# Patient Record
Sex: Male | Born: 1980 | Race: Black or African American | Hispanic: No | Marital: Single | Smoking: Former smoker
Health system: Southern US, Community
[De-identification: ages and names within clinical notes are randomized; demographics above are authoritative.]

## PROBLEM LIST (undated history)

## (undated) DIAGNOSIS — I10 Essential (primary) hypertension: Secondary | ICD-10-CM

## (undated) DIAGNOSIS — E119 Type 2 diabetes mellitus without complications: Secondary | ICD-10-CM

## (undated) DIAGNOSIS — G43909 Migraine, unspecified, not intractable, without status migrainosus: Secondary | ICD-10-CM

## (undated) DIAGNOSIS — G8929 Other chronic pain: Secondary | ICD-10-CM

## (undated) DIAGNOSIS — M549 Dorsalgia, unspecified: Secondary | ICD-10-CM

## (undated) HISTORY — PX: WRIST SURGERY: SHX841

---

## 2005-04-27 ENCOUNTER — Emergency Department (HOSPITAL_COMMUNITY): Admission: EM | Admit: 2005-04-27 | Discharge: 2005-04-27 | Payer: Self-pay | Admitting: Emergency Medicine

## 2006-03-21 ENCOUNTER — Ambulatory Visit (HOSPITAL_BASED_OUTPATIENT_CLINIC_OR_DEPARTMENT_OTHER): Admission: RE | Admit: 2006-03-21 | Discharge: 2006-03-21 | Payer: Self-pay | Admitting: Orthopedic Surgery

## 2007-04-08 ENCOUNTER — Emergency Department (HOSPITAL_COMMUNITY): Admission: EM | Admit: 2007-04-08 | Discharge: 2007-04-08 | Payer: Self-pay | Admitting: Emergency Medicine

## 2007-05-02 ENCOUNTER — Emergency Department (HOSPITAL_COMMUNITY): Admission: EM | Admit: 2007-05-02 | Discharge: 2007-05-02 | Payer: Self-pay | Admitting: Emergency Medicine

## 2007-06-11 ENCOUNTER — Emergency Department (HOSPITAL_COMMUNITY): Admission: EM | Admit: 2007-06-11 | Discharge: 2007-06-11 | Payer: Self-pay | Admitting: Emergency Medicine

## 2007-09-18 ENCOUNTER — Emergency Department (HOSPITAL_COMMUNITY): Admission: EM | Admit: 2007-09-18 | Discharge: 2007-09-18 | Payer: Self-pay | Admitting: Emergency Medicine

## 2008-01-01 ENCOUNTER — Emergency Department (HOSPITAL_COMMUNITY): Admission: EM | Admit: 2008-01-01 | Discharge: 2008-01-01 | Payer: Self-pay | Admitting: Emergency Medicine

## 2008-05-07 ENCOUNTER — Emergency Department (HOSPITAL_COMMUNITY): Admission: EM | Admit: 2008-05-07 | Discharge: 2008-05-07 | Payer: Self-pay | Admitting: Emergency Medicine

## 2008-08-24 ENCOUNTER — Emergency Department (HOSPITAL_COMMUNITY): Admission: EM | Admit: 2008-08-24 | Discharge: 2008-08-25 | Payer: Self-pay | Admitting: Emergency Medicine

## 2008-12-11 ENCOUNTER — Emergency Department (HOSPITAL_COMMUNITY): Admission: EM | Admit: 2008-12-11 | Discharge: 2008-12-12 | Payer: Self-pay | Admitting: Emergency Medicine

## 2008-12-15 ENCOUNTER — Ambulatory Visit (HOSPITAL_COMMUNITY): Admission: RE | Admit: 2008-12-15 | Discharge: 2008-12-15 | Payer: Self-pay | Admitting: Family Medicine

## 2009-02-09 ENCOUNTER — Emergency Department (HOSPITAL_COMMUNITY): Admission: EM | Admit: 2009-02-09 | Discharge: 2009-02-09 | Payer: Self-pay | Admitting: Emergency Medicine

## 2009-04-23 ENCOUNTER — Emergency Department (HOSPITAL_COMMUNITY): Admission: EM | Admit: 2009-04-23 | Discharge: 2009-04-24 | Payer: Self-pay | Admitting: Emergency Medicine

## 2009-06-22 ENCOUNTER — Emergency Department (HOSPITAL_COMMUNITY): Admission: EM | Admit: 2009-06-22 | Discharge: 2009-06-22 | Payer: Self-pay | Admitting: Emergency Medicine

## 2009-07-27 ENCOUNTER — Emergency Department (HOSPITAL_COMMUNITY): Admission: EM | Admit: 2009-07-27 | Discharge: 2009-07-28 | Payer: Self-pay | Admitting: Emergency Medicine

## 2009-10-05 ENCOUNTER — Emergency Department (HOSPITAL_COMMUNITY): Admission: EM | Admit: 2009-10-05 | Discharge: 2009-10-05 | Payer: Self-pay | Admitting: Emergency Medicine

## 2009-10-11 ENCOUNTER — Emergency Department (HOSPITAL_COMMUNITY): Admission: EM | Admit: 2009-10-11 | Discharge: 2009-10-11 | Payer: Self-pay | Admitting: Emergency Medicine

## 2009-12-24 ENCOUNTER — Emergency Department (HOSPITAL_COMMUNITY): Admission: EM | Admit: 2009-12-24 | Discharge: 2009-12-25 | Payer: Self-pay | Admitting: Emergency Medicine

## 2010-01-06 ENCOUNTER — Emergency Department (HOSPITAL_COMMUNITY): Admission: EM | Admit: 2010-01-06 | Discharge: 2010-01-07 | Payer: Self-pay | Admitting: Emergency Medicine

## 2010-02-12 ENCOUNTER — Emergency Department (HOSPITAL_COMMUNITY): Admission: EM | Admit: 2010-02-12 | Discharge: 2010-02-12 | Payer: Self-pay | Admitting: Emergency Medicine

## 2010-06-10 ENCOUNTER — Emergency Department (HOSPITAL_COMMUNITY): Admission: EM | Admit: 2010-06-10 | Discharge: 2010-02-03 | Payer: Self-pay | Admitting: Emergency Medicine

## 2010-09-19 LAB — BASIC METABOLIC PANEL
BUN: 11 mg/dL (ref 6–23)
Chloride: 105 mEq/L (ref 96–112)
Creatinine, Ser: 0.91 mg/dL (ref 0.4–1.5)
Glucose, Bld: 148 mg/dL — ABNORMAL HIGH (ref 70–99)
Potassium: 3.8 mEq/L (ref 3.5–5.1)

## 2010-09-19 LAB — CBC
HCT: 46.7 % (ref 39.0–52.0)
MCV: 90.1 fL (ref 78.0–100.0)
Platelets: 261 10*3/uL (ref 150–400)
RDW: 13.4 % (ref 11.5–15.5)

## 2010-09-19 LAB — DIFFERENTIAL
Basophils Absolute: 0.1 10*3/uL (ref 0.0–0.1)
Basophils Relative: 1 % (ref 0–1)
Eosinophils Absolute: 0 10*3/uL (ref 0.0–0.7)
Eosinophils Relative: 0 % (ref 0–5)

## 2010-09-19 LAB — D-DIMER, QUANTITATIVE: D-Dimer, Quant: 0.25 ug/mL-FEU (ref 0.00–0.48)

## 2010-09-22 LAB — URINALYSIS, ROUTINE W REFLEX MICROSCOPIC
Bilirubin Urine: NEGATIVE
Glucose, UA: NEGATIVE mg/dL
Hgb urine dipstick: NEGATIVE
Nitrite: NEGATIVE
Specific Gravity, Urine: 1.03 (ref 1.005–1.030)
pH: 5.5 (ref 5.0–8.0)

## 2010-10-09 LAB — URINALYSIS, ROUTINE W REFLEX MICROSCOPIC
Glucose, UA: NEGATIVE mg/dL
Specific Gravity, Urine: >1.03 — ABNORMAL HIGH (ref 1.005–1.030)
Urobilinogen, UA: 0.2 mg/dL (ref 0.0–1.0)

## 2010-10-09 LAB — URINE MICROSCOPIC-ADD ON

## 2010-10-11 LAB — DIFFERENTIAL
Basophils Relative: 0 % (ref 0–1)
Eosinophils Absolute: 0.1 10*3/uL (ref 0.0–0.7)
Eosinophils Relative: 1 % (ref 0–5)
Monocytes Absolute: 0.6 10*3/uL (ref 0.1–1.0)
Monocytes Relative: 8 % (ref 3–12)
Neutrophils Relative %: 62 % (ref 43–77)

## 2010-10-11 LAB — CREATININE, SERUM: GFR calc Af Amer: >60 mL/min (ref >60)

## 2010-10-11 LAB — POCT I-STAT, CHEM 8
Calcium, Ion: 1.21 mmol/L (ref 1.12–1.32)
Glucose, Bld: 114 mg/dL — ABNORMAL HIGH (ref 70–99)
HCT: 49 % (ref 39.0–52.0)
Hemoglobin: 16.7 g/dL (ref 13.0–17.0)
TCO2: 23 mmol/L (ref 0–100)

## 2010-10-11 LAB — CBC
Hemoglobin: 16.2 g/dL (ref 13.0–17.0)
MCHC: 34.5 g/dL (ref 30.0–36.0)
MCV: 92 fL (ref 78.0–100.0)
RBC: 5.1 MIL/uL (ref 4.22–5.81)

## 2010-11-19 NOTE — Op Note (Signed)
[name], [name]                ACCOUNT NO.:  [id number]   MEDICAL RECORD NO.:  [id number]          PATIENT TYPE:  AMB   LOCATION:  DSC                          FACILITY:  [facility]   PHYSICIAN:  Cindee Salt, M.D.       DATE OF BIRTH:  Sep 09, 1980   DATE OF PROCEDURE:  03/21/2006  DATE OF DISCHARGE:                                 OPERATIVE REPORT   PREOPERATIVE DIAGNOSIS:  Subluxating extensor carpi  ulnaris tendon right  wrist with questionable triangular fibrocartilage complex tear.   POSTOPERATIVE DIAGNOSIS:  Subluxating extensor carpi ulnaris tendon right  wrist with questionable triangular fibrocartilage complex tear.   OPERATION:  Arthroscopy and repair peripheral 1B tear right wrist combined  Tuohy-Whipple technique with open reconstruction extensor carpi ulnaris  sheath.   SURGEON:  Cindee Salt, M.D.   ASSISTANT:  Carolyne Fiscal R.N.   ANESTHESIA:  Axillary block.   HISTORY:  The patient is a 30 year old male with a history of ulnar sided  wrist pain subluxating ECU tendon, questionable tear of the peripheral  aspect of the triangle fibrocartilage complex not responsive to conservative  treatment.   PROCEDURE:  The patient is brought to the operating room, questions answered  in the preoperative area.  The extremity is marked.  He is well aware of  risks and complications including infection, recurrence, incomplete relief  of symptoms, dystrophy, injury to arteries, nerves, tendons, the possible  injury to the dorsal sensory branch of the ulnar nerve.  The extremity was  marked by both the patient and surgeon.  The patient was brought to the  operating room where an axillary block was carried out without difficulty.  He was prepped using DuraPrep, supine position, right arm free.  After a 3  minute dry time he was draped.  The wrist placed in the arthroscopy tower,  10 pounds traction applied, joint inflated through a 3-4 portal.  A  transverse incision made through skin only,  deepened with hemostat.  Blunt  trocar was used enter the joint, the joint was inspected. The volar radial  wrist ligaments carpal articular surface was intact.  The scapholunate  lunotriquetral ligaments were intact.  An irrigation catheter was placed in  the 6U, a 4-5 6U portal opened after localization with a 22 gauge needle.  A  probe was introduced. Significant granulation tissue was present at the  dorsal ulnar aspect of the attachment of the triangular fibrocartilage. A  probe was placed and the triangular fibrocartilage was easily separated from  the capsule.  The midcarpal joint was then inspected after localization and  inflation of the joint through the midcarpal radial portal.  The joint was  entered using a hemostat and a blunt trocar. The CT joint showed no changes.  There was no instability of the scapholunate and lunotriquetral joint, type  2 lunate was present, there were no changes in the proximal hamate or  capitate.  A Tuohy needle was then used to pass the suture after debridement  of the granulation tissue of the triangular fibrocartilage. This was done  with the scope in the 4-5  portal.  The 1-2 portal was entered with bent  Tuohy needle.  This was then traversed across the joint.  The scope was then  introduced in the 3-4 portal.  The Tuohy needle was grasped and passed  through the dorsal margin of the triangular fibrocartilage complex.  The  limb was then elevated for exsanguination. A tourniquet placed high on the  arm was inflated to 250 mm.  A longitudinal incision was then made over the  ulnar aspect of the wrist, carried down through subcutaneous tissue.  Bleeders were electrocauterized.  The dorsal sensory branch of the ulnar  nerve was looked for but was not seen.  An incision was then made in the  sixth dorsal compartment.  This allowed visualization of the extensor carpi  ulnaris tendon which was easily subluxating from its sheath.  The sheath was   maintained on its most dorsal aspect.  The Tuohy needle was then brought  back through the skin, the skin undermined, and this was able to be isolated  at the margin of the sixth dorsal compartment.  The scope was reintroduced.  An 18 gauge needle was then passed through the base of the sheath of the  ECU.  This was identified and the suture was then brought through the radial  side of the wrist. A stainless steel monofilament wire was then passed  through the 18 gauge needle and the suture looped through this and brought  out and tied firmly securing the triangular fibrocartilage back to the  dorsal capsule.  The area was irrigated.  The ECU sheath was then imbricated  stabilizing the ECU tendon.  This was done with figure-of-eight 4-0  Mersilene sutures.  The skin was then closed with a subcuticular 2-0 Vicryl  suture.  The portals were closed with interrupted 5-0 nylon sutures.  A  sterile compressive dressing and long-arm splint with the wrist supinated  was then applied.  The patient tolerated the procedure well and was taken to  the recovery room for observation in satisfactory condition.  He will be  discharged home to return to the [facility] in one week on  Vicodin.           ______________________________  Cindee Salt, M.D.     GK/MEDQ  D:  03/21/2006  T:  03/22/2006  Job:  [id number]

## 2011-05-26 ENCOUNTER — Encounter: Payer: Self-pay | Admitting: *Deleted

## 2011-05-26 ENCOUNTER — Emergency Department (HOSPITAL_COMMUNITY)
Admission: EM | Admit: 2011-05-26 | Discharge: 2011-05-26 | Disposition: A | Discharge status: Home / Self Care | Payer: Self-pay | Attending: Emergency Medicine | Admitting: Emergency Medicine

## 2011-05-26 DIAGNOSIS — G8929 Other chronic pain: Secondary | ICD-10-CM | POA: Insufficient documentation

## 2011-05-26 DIAGNOSIS — F17200 Nicotine dependence, unspecified, uncomplicated: Secondary | ICD-10-CM | POA: Insufficient documentation

## 2011-05-26 DIAGNOSIS — IMO0002 Reserved for concepts with insufficient information to code with codable children: Secondary | ICD-10-CM | POA: Insufficient documentation

## 2011-05-26 DIAGNOSIS — G44209 Tension-type headache, unspecified, not intractable: Secondary | ICD-10-CM | POA: Insufficient documentation

## 2011-05-26 DIAGNOSIS — M62838 Other muscle spasm: Secondary | ICD-10-CM | POA: Insufficient documentation

## 2011-05-26 DIAGNOSIS — M549 Dorsalgia, unspecified: Secondary | ICD-10-CM | POA: Insufficient documentation

## 2011-05-26 DIAGNOSIS — I10 Essential (primary) hypertension: Secondary | ICD-10-CM | POA: Insufficient documentation

## 2011-05-26 HISTORY — DX: Other chronic pain: G89.29

## 2011-05-26 HISTORY — DX: Dorsalgia, unspecified: M54.9

## 2011-05-26 HISTORY — DX: Essential (primary) hypertension: I10

## 2011-05-26 MED ORDER — DIPHENHYDRAMINE HCL 50 MG/ML IJ SOLN
25.0000 mg | Freq: Once | INTRAMUSCULAR | Status: AC
  Administered 2011-05-26: 25 mg via INTRAVENOUS
  Filled 2011-05-26: qty 1

## 2011-05-26 MED ORDER — HYDROMORPHONE HCL PF 1 MG/ML IJ SOLN
1.0000 mg | Freq: Once | INTRAMUSCULAR | Status: AC
  Administered 2011-05-26: 1 mg via INTRAVENOUS
  Filled 2011-05-26: qty 1

## 2011-05-26 MED ORDER — PROMETHAZINE HCL 25 MG/ML IJ SOLN
25.0000 mg | Freq: Once | INTRAMUSCULAR | Status: AC
  Administered 2011-05-26: 25 mg via INTRAVENOUS
  Filled 2011-05-26: qty 1

## 2011-05-26 MED ORDER — SODIUM CHLORIDE 0.9 % IV BOLUS (SEPSIS)
1000.0000 mL | Freq: Once | INTRAVENOUS | Status: AC
  Administered 2011-05-26: 1000 mL via INTRAVENOUS

## 2011-05-26 MED ORDER — IBUPROFEN 800 MG PO TABS: 800.0000 mg | ORAL_TABLET | Freq: Three times a day (TID) | ORAL | Status: AC | PRN

## 2011-05-26 MED ORDER — DEXAMETHASONE SODIUM PHOSPHATE 4 MG/ML IJ SOLN
10.0000 mg | Freq: Once | INTRAMUSCULAR | Status: AC
  Administered 2011-05-26: 10 mg via INTRAVENOUS
  Filled 2011-05-26: qty 3

## 2011-05-26 NOTE — ED Provider Notes (Signed)
Scribed for Corey Bonier, MD, the patient was seen in room APA12/APA12. This chart was scribed by [employer]. The patient's care started at 10:03  CSN: [id number] Arrival date & time: 05/26/2011 10:02 AM   First MD Initiated Contact with Patient 05/26/11 1003      Chief Complaint  Patient presents with  . Migraine  . Back Pain   HPI Corey [name] is a 30 y.o. male who presents to the Emergency Department complaining of Migraine. Migraine is with gradual onset around 7pm last night. Reports a history of headaches. Migraine is aggravated by headaches. He also complains of back pain. Per wife, patient's blood pressure has been "very high" recently. Reports a history of back injury las year. He also compains of a chronic numbness in his groin area.  Past Medical History  Diagnosis Date  . Hypertension   . Chronic back pain     Past Surgical History  Procedure Date  . Wrist surgery right    History reviewed. No pertinent family history.  History  Substance Use Topics  . Smoking status: Current Everyday Smoker -- 0.5 packs/day    Types: Cigarettes  . Smokeless tobacco: Not on file  . Alcohol Use: Yes     1-2 beers every weekend      Review of Systems  Constitutional: Negative for fever and unexpected weight change.  HENT: Negative for ear pain, sore throat, neck pain and neck stiffness.   Eyes: Negative for visual disturbance.  Respiratory: Negative for cough.   Cardiovascular: Negative for chest pain.  Gastrointestinal: Negative for vomiting and diarrhea.  Genitourinary: Negative.   Musculoskeletal: Negative for myalgias.  Skin: Negative for rash.  Neurological: Positive for headaches. Negative for dizziness, seizures and syncope.  Hematological: Negative.   Psychiatric/Behavioral: Negative.   All other systems reviewed and are negative.    Allergies  Hydrocodone  Home Medications  No current outpatient prescriptions on file.  BP 159/113  Pulse 70   Temp(Src) 97.6 F (36.4 C) (Oral)  Resp 18  Ht 6\' 3"  (1.905 m)  Wt 250 lb (113.399 kg)  BMI 31.25 kg/m2  SpO2 99%  Physical Exam  Nursing note and vitals reviewed. Constitutional: He is oriented to person, place, and time. He appears well-developed and well-nourished.       Awake, alert, nontoxic appearance with baseline speech for patient.  HENT:  Head: Normocephalic and atraumatic.  Right Ear: Tympanic membrane normal.  Left Ear: Tympanic membrane normal.  Mouth/Throat: Oropharynx is clear and moist. No oropharyngeal exudate, posterior oropharyngeal edema or posterior oropharyngeal erythema.  Eyes: EOM are normal. Pupils are equal, round, and reactive to light. Right eye exhibits no discharge. Left eye exhibits no discharge.       Normal funduscopic exam  Neck: Normal range of motion. Neck supple. No Brudzinski's sign and no Kernig's sign noted.       No neck stiffness  Cardiovascular: Normal rate, regular rhythm and normal heart sounds.   No murmur heard. Pulmonary/Chest: Effort normal and breath sounds normal. No stridor. No respiratory distress. He has no wheezes. He has no rales. He exhibits no tenderness.  Abdominal: Soft. Bowel sounds are normal. He exhibits no mass. There is no tenderness. There is no rebound.  Musculoskeletal: Normal range of motion. He exhibits no tenderness.       Mild tenderness at the Lumbar spine. No deformity appreciated Some left peri-lumbar muscular spasm  Lymphadenopathy:    He has no cervical adenopathy.  Neurological: He  is alert and oriented to person, place, and time. He has normal reflexes. He displays no atrophy and no tremor. No cranial nerve deficit or sensory deficit. He exhibits normal muscle tone. Coordination and gait normal. GCS eye subscore is 4. GCS verbal subscore is 5. GCS motor subscore is 6.  Reflex Scores:      Tricep reflexes are 2+ on the right side and 2+ on the left side.      Bicep reflexes are 2+ on the right side and 2+ on  the left side.      Brachioradialis reflexes are 2+ on the right side and 2+ on the left side.      Patellar reflexes are 2+ on the right side and 2+ on the left side.      Achilles reflexes are 2+ on the right side and 2+ on the left side.      Normal finger-nose-finger test Normal coordination No Disdiadochokinesia Normal visual fields to palpitation. Normal DTRs  Skin: No rash noted.  Psychiatric: He has a normal mood and affect.    ED Course  Procedures DIAGNOSTIC STUDIES: Oxygen Saturation is 99% on room air, normal by my interpretation.    COORDINATION OF CARE: 10:10 - EDP examined patient at bedside. IV fluids and pain management ordered.  11:34 AM This patient reports that his headache is now resolved in his back pain is improved and he would like to go home.  MDM: The patient is known to have chronic back pain , and he has had no recent injury to his back, and I do not see a need for lumbar spine x-rays at this time as they have been performed previously. The patient is known to have degenerative disc disease and he has muscle spasm of the left paraspinal lumbar muscles suggestive of muscle strain and spasm. He tells me that he has a history of recurrent headaches for which the one he is presenting is typical, and he has a negative neurologic exam and symptoms that are suggestive of muscular tension headache. At this time I will not order a CT scan of his brain as I don't find suggestion on his physical examination of focal neurologic abnormalities.   Scribe Attestation I personally performed the services described in this documentation, which was scribed in my presence. The recorded information has been reviewed and considered.      Corey Bonier, MD 05/26/11 1134

## 2011-05-26 NOTE — ED Notes (Signed)
Pt reports migraine since 7pm last night with nausea, denies vomiting.  Also reports history of sciatica and for the past 2 days has been having lower back pain radiating down r leg.

## 2011-05-26 NOTE — ED Notes (Signed)
Pt c/o severe migraine that started last night around 7pm and back pain

## 2011-12-10 ENCOUNTER — Emergency Department (HOSPITAL_COMMUNITY)
Admission: EM | Admit: 2011-12-10 | Discharge: 2011-12-10 | Disposition: A | Discharge status: Home / Self Care | Payer: Self-pay | Attending: Emergency Medicine | Admitting: Emergency Medicine

## 2011-12-10 ENCOUNTER — Encounter (HOSPITAL_COMMUNITY): Payer: Self-pay

## 2011-12-10 DIAGNOSIS — G8929 Other chronic pain: Secondary | ICD-10-CM | POA: Insufficient documentation

## 2011-12-10 DIAGNOSIS — I10 Essential (primary) hypertension: Secondary | ICD-10-CM | POA: Insufficient documentation

## 2011-12-10 DIAGNOSIS — R51 Headache: Secondary | ICD-10-CM | POA: Insufficient documentation

## 2011-12-10 HISTORY — DX: Migraine, unspecified, not intractable, without status migrainosus: G43.909

## 2011-12-10 MED ORDER — HYDROMORPHONE HCL PF 1 MG/ML IJ SOLN
1.0000 mg | Freq: Once | INTRAMUSCULAR | Status: AC
  Administered 2011-12-10: 1 mg via INTRAVENOUS
  Filled 2011-12-10: qty 1

## 2011-12-10 MED ORDER — ONDANSETRON HCL 4 MG/2ML IJ SOLN
4.0000 mg | Freq: Once | INTRAMUSCULAR | Status: AC
  Administered 2011-12-10: 4 mg via INTRAVENOUS
  Filled 2011-12-10: qty 2

## 2011-12-10 MED ORDER — DIPHENHYDRAMINE HCL 50 MG/ML IJ SOLN
25.0000 mg | Freq: Once | INTRAMUSCULAR | Status: AC
  Administered 2011-12-10: 25 mg via INTRAVENOUS
  Filled 2011-12-10: qty 1

## 2011-12-10 MED ORDER — KETOROLAC TROMETHAMINE 30 MG/ML IJ SOLN
30.0000 mg | Freq: Once | INTRAMUSCULAR | Status: AC
  Administered 2011-12-10: 30 mg via INTRAVENOUS
  Filled 2011-12-10: qty 1

## 2011-12-10 MED ORDER — HYDROMORPHONE HCL PF 1 MG/ML IJ SOLN
INTRAMUSCULAR | Status: AC
  Administered 2011-12-10: 1 mg
  Filled 2011-12-10: qty 1

## 2011-12-10 MED ORDER — TRAMADOL HCL 50 MG PO TABS: 50.0000 mg | ORAL_TABLET | Freq: Four times a day (QID) | ORAL | Status: AC | PRN

## 2011-12-10 MED ORDER — METOCLOPRAMIDE HCL 5 MG/ML IJ SOLN
10.0000 mg | Freq: Once | INTRAMUSCULAR | Status: AC
  Administered 2011-12-10: 10 mg via INTRAVENOUS
  Filled 2011-12-10: qty 2

## 2011-12-10 MED ORDER — ONDANSETRON HCL 8 MG PO TABS: 8.0000 mg | ORAL_TABLET | ORAL | Status: AC | PRN

## 2011-12-10 NOTE — ED Provider Notes (Signed)
History   This chart was scribed for Donnetta Hutching, MD by Sofie Rower. The patient was seen in room APA05/APA05 and the patient's care was started at 10:37 AM     CSN: [id number]  Arrival date & time 12/10/11  4098   First MD Initiated Contact with Patient 12/10/11 1004      Chief Complaint  Patient presents with  . Headache    (Consider location/radiation/quality/duration/timing/severity/associated sxs/prior treatment) HPI  Corey [name] is a 31 y.o. male who presents to the Emergency Department complaining of moderate, episodic headache onset three days ago with associated symptoms of photophobia. The pt states the "headache is located right behind my eyes." Modifying factors include Excedrin migraine, tylenol extra strength which provide moderate relief. Pt has a hx of hypertension, chronic back pain, and migraine headaches.   Pt denies having insurance.     Past Medical History  Diagnosis Date  . Hypertension   . Chronic back pain   . Migraine     Past Surgical History  Procedure Date  . Wrist surgery right      History  Substance Use Topics  . Smoking status: Current Everyday Smoker -- 0.5 packs/day    Types: Cigarettes  . Smokeless tobacco: Not on file  . Alcohol Use: Yes     1-2 beers every weekend      Review of Systems  All other systems reviewed and are negative.    10 Systems reviewed and all are negative for acute change except as noted in the HPI.    Allergies  Hydrocodone  Home Medications   Current Outpatient Rx  Name Route Sig Dispense Refill  . ACETAMINOPHEN 500 MG PO TABS Oral Take 500 mg by mouth every 6 (six) hours as needed. Pain       BP 164/103  Pulse 76  Temp 98.6 F (37 C)  Resp 20  Ht 6\' 3"  (1.905 m)  Wt 265 lb (120.203 kg)  BMI 33.12 kg/m2  SpO2 100%  Physical Exam  Nursing note and vitals reviewed. Constitutional: He appears well-developed and well-nourished.  HENT:  Right Ear: External ear normal.  Left Ear:  External ear normal.  Nose: Nose normal.  Eyes:       Photophobic.   Neck: Normal range of motion.  Cardiovascular: Normal rate and normal heart sounds.   Pulmonary/Chest: Effort normal.  Musculoskeletal: Normal range of motion.  Neurological: He is alert.  Skin: Skin is warm and dry.  Psychiatric: He has a normal mood and affect. His behavior is normal.    ED Course  Procedures (including critical care time)  DIAGNOSTIC STUDIES: Oxygen Saturation is 100% on room air, normal by my interpretation.    COORDINATION OF CARE:  10:39 AM-EDP at bedside discusses treatment plan concerning IV Toradol, benadryl, regalin.  12:39PM- Recheck. EDP at bedside discusses treatment plan.    Labs Reviewed - No data to display No results found.   No diagnosis found.    MDM  Recheck prior to discharge.  Feeling better. Decreased headache. No neuro deficits. No stiff neck       I personally performed the services described in this documentation, which was scribed in my presence. The recorded information has been reviewed and considered.    Donnetta Hutching, MD 12/10/11 [phone]

## 2011-12-10 NOTE — ED Notes (Signed)
Pt states meds starting to work. Rating pain 8 at this time. On cell phone

## 2011-12-10 NOTE — ED Notes (Signed)
Pt medicated for pain.

## 2011-12-10 NOTE — Discharge Instructions (Signed)
Medication for pain and nausea. Increase fluids. Rest. Return if worse.

## 2011-12-10 NOTE — ED Notes (Signed)
Complain of headache. Out of BP med

## 2013-03-12 ENCOUNTER — Emergency Department (HOSPITAL_COMMUNITY): Payer: Self-pay

## 2013-03-12 ENCOUNTER — Emergency Department (HOSPITAL_COMMUNITY)
Admission: EM | Admit: 2013-03-12 | Discharge: 2013-03-12 | Disposition: A | Discharge status: Home / Self Care | Payer: Self-pay | Attending: Emergency Medicine | Admitting: Emergency Medicine

## 2013-03-12 ENCOUNTER — Encounter (HOSPITAL_COMMUNITY): Payer: Self-pay | Admitting: *Deleted

## 2013-03-12 DIAGNOSIS — R079 Chest pain, unspecified: Secondary | ICD-10-CM | POA: Insufficient documentation

## 2013-03-12 DIAGNOSIS — I10 Essential (primary) hypertension: Secondary | ICD-10-CM | POA: Insufficient documentation

## 2013-03-12 DIAGNOSIS — Z79899 Other long term (current) drug therapy: Secondary | ICD-10-CM | POA: Insufficient documentation

## 2013-03-12 DIAGNOSIS — R0602 Shortness of breath: Secondary | ICD-10-CM | POA: Insufficient documentation

## 2013-03-12 DIAGNOSIS — Z87891 Personal history of nicotine dependence: Secondary | ICD-10-CM | POA: Insufficient documentation

## 2013-03-12 DIAGNOSIS — G8929 Other chronic pain: Secondary | ICD-10-CM | POA: Insufficient documentation

## 2013-03-12 DIAGNOSIS — G43909 Migraine, unspecified, not intractable, without status migrainosus: Secondary | ICD-10-CM | POA: Insufficient documentation

## 2013-03-12 LAB — CBC WITH DIFFERENTIAL/PLATELET
Eosinophils Relative: 1 % (ref 0–5)
HCT: 46.1 % (ref 39.0–52.0)
Hemoglobin: 16.4 g/dL (ref 13.0–17.0)
Lymphocytes Relative: 48 % — ABNORMAL HIGH (ref 12–46)
MCHC: 35.6 g/dL (ref 30.0–36.0)
MCV: 88.8 fL (ref 78.0–100.0)
Monocytes Absolute: 0.5 10*3/uL (ref 0.1–1.0)
Monocytes Relative: 6 % (ref 3–12)
Neutro Abs: 3.7 10*3/uL (ref 1.7–7.7)
RDW: 13.3 % (ref 11.5–15.5)
WBC: 8.5 10*3/uL (ref 4.0–10.5)

## 2013-03-12 LAB — BASIC METABOLIC PANEL
BUN: 12 mg/dL (ref 6–23)
CO2: 25 mEq/L (ref 19–32)
Chloride: 101 mEq/L (ref 96–112)
Creatinine, Ser: 1.13 mg/dL (ref 0.50–1.35)
GFR calc Af Amer: >90 mL/min (ref >90)
Potassium: 3.7 mEq/L (ref 3.5–5.1)

## 2013-03-12 MED ORDER — NAPROXEN 500 MG PO TABS: 500.0000 mg | ORAL_TABLET | Freq: Two times a day (BID) | ORAL | Status: DC

## 2013-03-12 MED ORDER — CYCLOBENZAPRINE HCL 10 MG PO TABS: 10.0000 mg | ORAL_TABLET | Freq: Two times a day (BID) | ORAL | Status: DC | PRN

## 2013-03-12 NOTE — ED Notes (Signed)
Pt reporting pain in throat and down mid chest.  Reports increased cough.  States he just quit smoking about 1 week ago.  Pt reporting headache. Denies nausea or vomiting.

## 2013-03-12 NOTE — ED Provider Notes (Signed)
CSN: [id number]     Arrival date & time 03/12/13  0002 History   First MD Initiated Contact with Patient 03/12/13 0020     Chief Complaint  Patient presents with  . Shortness of Breath  . Chest Pain   (Consider location/radiation/quality/duration/timing/severity/associated sxs/prior Treatment) HPI.... intermittent chest pain for 10 days described as pins and needles with no associated dyspnea, diaphoresis, nausea. Patient recently quit smoking. Nothing makes symptoms better or worse. Severity is mild. No family history of cardiac disease. Patient has hypertension and  takes lisinopril.  Past Medical History  Diagnosis Date  . Hypertension   . Chronic back pain   . Migraine    Past Surgical History  Procedure Laterality Date  . Wrist surgery  right   History reviewed. No pertinent family history. History  Substance Use Topics  . Smoking status: Former Smoker -- 0.50 packs/day    Types: Cigarettes  . Smokeless tobacco: Not on file  . Alcohol Use: Yes     Comment: 1-2 beers every weekend    Review of Systems  All other systems reviewed and are negative.    Allergies  Hydrocodone and Morphine and related  Home Medications   Current Outpatient Rx  Name  Route  Sig  Dispense  Refill  . lisinopril (PRINIVIL,ZESTRIL) 20 MG tablet   Oral   Take 20 mg by mouth daily.         Marland Kitchen acetaminophen (TYLENOL) 500 MG tablet   Oral   Take 1,500 mg by mouth every 6 (six) hours as needed. Pain/headaches         . aspirin-acetaminophen-caffeine (EXCEDRIN MIGRAINE) 250-250-65 MG per tablet   Oral   Take 1 tablet by mouth every 6 (six) hours as needed. For migraines          BP 139/89  Pulse 62  Temp(Src) 98.2 F (36.8 C) (Oral)  Resp 18  Ht 6\' 3"  (1.905 m)  Wt 280 lb (127.007 kg)  BMI 35 kg/m2  SpO2 98% Physical Exam  Nursing note and vitals reviewed. Constitutional: He is oriented to person, place, and time. He appears well-developed and well-nourished.  HENT:  Head:  Normocephalic and atraumatic.  Eyes: Conjunctivae and EOM are normal. Pupils are equal, round, and reactive to light.  Neck: Normal range of motion. Neck supple.  Cardiovascular: Normal rate, regular rhythm and normal heart sounds.   Pulmonary/Chest: Effort normal and breath sounds normal.  Abdominal: Soft. Bowel sounds are normal.  Musculoskeletal: Normal range of motion.  Neurological: He is alert and oriented to person, place, and time.  Skin: Skin is warm and dry.  Psychiatric: He has a normal mood and affect.    ED Course  Procedures (including critical care time) Labs Review Labs Reviewed  BASIC METABOLIC PANEL - Abnormal; Notable for the following:    GFR calc non Af Amer 84 (*)    All other components within normal limits  CBC WITH DIFFERENTIAL - Abnormal; Notable for the following:    Lymphocytes Relative 48 (*)    Lymphs Abs 4.1 (*)    All other components within normal limits  TROPONIN I   Imaging Review No results found.  Date: 03/12/2013  Rate: 50  Rhythm: sinus brady c SA QRS Axis: normal  Intervals: normal  ST/T Wave abnormalities: normal  Conduction Disutrbances: none  Narrative Interpretation: unremarkable    MDM  No diagnosis found. Patient is very low risk for ACS or PE. Patient is hemodynamically stable. Screening test negative.  Discharge meds Naprosyn 500 mg and Flexeril 10 mg    Donnetta Hutching, MD 03/12/13 [phone]

## 2013-12-08 ENCOUNTER — Emergency Department (HOSPITAL_COMMUNITY)
Admission: EM | Admit: 2013-12-08 | Discharge: 2013-12-08 | Disposition: A | Discharge status: Home / Self Care | Payer: Self-pay | Attending: Emergency Medicine | Admitting: Emergency Medicine

## 2013-12-08 ENCOUNTER — Encounter (HOSPITAL_COMMUNITY): Payer: Self-pay | Admitting: Emergency Medicine

## 2013-12-08 DIAGNOSIS — Z79899 Other long term (current) drug therapy: Secondary | ICD-10-CM | POA: Insufficient documentation

## 2013-12-08 DIAGNOSIS — G8929 Other chronic pain: Secondary | ICD-10-CM | POA: Insufficient documentation

## 2013-12-08 DIAGNOSIS — G43909 Migraine, unspecified, not intractable, without status migrainosus: Secondary | ICD-10-CM

## 2013-12-08 DIAGNOSIS — I10 Essential (primary) hypertension: Secondary | ICD-10-CM | POA: Insufficient documentation

## 2013-12-08 DIAGNOSIS — F17200 Nicotine dependence, unspecified, uncomplicated: Secondary | ICD-10-CM | POA: Insufficient documentation

## 2013-12-08 MED ORDER — DIPHENHYDRAMINE HCL 50 MG/ML IJ SOLN
50.0000 mg | Freq: Once | INTRAMUSCULAR | Status: AC
  Administered 2013-12-08: 50 mg via INTRAMUSCULAR
  Filled 2013-12-08: qty 1

## 2013-12-08 MED ORDER — KETOROLAC TROMETHAMINE 60 MG/2ML IM SOLN
60.0000 mg | Freq: Once | INTRAMUSCULAR | Status: AC
  Administered 2013-12-08: 60 mg via INTRAMUSCULAR
  Filled 2013-12-08: qty 2

## 2013-12-08 MED ORDER — METOCLOPRAMIDE HCL 10 MG PO TABS: 10.0000 mg | ORAL_TABLET | Freq: Four times a day (QID) | ORAL | Status: DC | PRN

## 2013-12-08 MED ORDER — METOCLOPRAMIDE HCL 5 MG/ML IJ SOLN
10.0000 mg | Freq: Once | INTRAMUSCULAR | Status: AC
  Administered 2013-12-08: 10 mg via INTRAMUSCULAR
  Filled 2013-12-08: qty 2

## 2013-12-08 NOTE — Discharge Instructions (Signed)
°Emergency Department Resource Guide °1) Find a Doctor and Pay Out of Pocket °Although you won't have to find out who is covered by your insurance plan, it is a good idea to ask around and get recommendations. You will then need to call the office and see if the doctor you have chosen will accept you as a new patient and what types of options they offer for patients who are self-pay. Some doctors offer discounts or will set up payment plans for their patients who do not have insurance, but you will need to ask so you aren't surprised when you get to your appointment. ° °2) Contact Your Local Health Department °Not all health departments have doctors that can see patients for sick visits, but many do, so it is worth a call to see if yours does. If you don't know where your local health department is, you can check in your phone book. The CDC also has a tool to help you locate your state's health department, and many state websites also have listings of all of their local health departments. ° °3) Find a Walk-in Clinic °If your illness is not likely to be very severe or complicated, you may want to try a walk in clinic. These are popping up all over the country in pharmacies, drugstores, and shopping centers. They're usually staffed by nurse practitioners or physician assistants that have been trained to treat common illnesses and complaints. They're usually fairly quick and inexpensive. However, if you have serious medical issues or chronic medical problems, these are probably not your best option. ° °No Primary Care Doctor: °- Call [facility] at  832-8000 - they can help you locate a primary care doctor that  accepts your insurance, provides certain services, etc. °- Physician Referral Service- 1-800-533-3463 ° °Chronic Pain Problems: °Organization         Address  Phone   Notes  °[facility]  (336) 297-2271 Patients need to be referred by their primary care doctor.  ° °Medication  Assistance: °Organization         Address  Phone   Notes  °[place] [facility] [address] °[place], [place] [address] (336) 641-8030 --Must be a resident of [place] °-- Must have NO insurance coverage whatsoever (no Medicaid/ Medicare, etc.) °-- The pt. MUST have a primary care doctor that directs their care regularly and follows them in the community °  °[facility]  (866) 331-1348   °[employer]  (888) 892-1162   ° °Agencies that provide inexpensive medical care: °Organization         Address  Phone   Notes  °[facility]  (336) 832-8035   °[facility]    (336) 832-7272   °[facility] [address] °[place], [place] [address] (336) 832-4777   °[facility] [address], °[place] (336) 271-4999   °[facility]    (336) 373-0678   °[facility]    (336) 272-1050   °[facility] ° [address], [place] Phone:  (336) 832-4444, Fax:  (336) 832-4440 Hours of Operation:  9 am - 6 pm, M-F.  Also accepts Medicaid/Medicare and self-pay.  ° [facility] ° [address], [place] Phone: (336) 832-3150, Fax: (336) 832-3151. Hours of Operation:  8:30 am - 5:30 pm, M-F.  Also accepts Medicaid and self-pay.  °[facility] [place] [address], [place] Phone: (336) 878-6027   °[facility] [address], [place], [place] (336)723-1848, Ext. 123 Mondays & Thursdays: 7-9 AM.  First 15 patients are seen on a first come, first serve basis. °  ° °Medicaid-accepting [place] Providers: ° °Organization         Address  Phone   Notes  °[facility] [address], [place] (336) 641-2100 Also accepts self-pay patients.  °[facility] [address], [place] ° (336) 856-9996   °[facility] [address], [place]  (336) 288-8857   °[facility] [address] [place] [address], [place] (336) 299-7000   °Veita Bland [address], [place]  ° (336) 373-1557 Only accepts [place] Access Medicaid patients after they have their name applied to their card.  ° °Self-Pay (no insurance) in [place]: ° °Organization         Address  Phone   Notes  °Sickle Cell Patients, [facility] [address], [place] (336) 832-1970   °[facility] [address], [place] (336) 832-4400   °[facility] [place] ° [address], [place] (336) 992-4800   °[facility]/Dr. Osei-Bonsu ° [address] [place] [address], [place] or [address], [place] (336) 841-8500 Phone number for both [place] and [place] locations is the same.  °[facility] [address], [place] (336) 299-0000   °[facility] [address] [place] [address], [place] or [address] (336) 852-7530 °(336) 878-2260   °[facility] [address], [place] (336) 350-1642, phone; (336) 294-5005, fax Sees patients 1st and 3rd Saturday of every month.  Must not qualify for public or private insurance (i.e. Medicaid, Medicare, [place] Health Choice, Veterans' Benefits) • Household income should be no more than 200% of the poverty level •The clinic cannot treat you if you are pregnant or think you are pregnant • Sexually transmitted diseases are not treated at the clinic.  ° ° °Dental Care: °Organization         Address  Phone  Notes  °[place] [facility] [address], [place] (336) 641-6152 Accepts children up to age 21 who are enrolled in Medicaid or [place] Health Choice; pregnant women with a Medicaid card; and children who have applied for Medicaid or [place] Health Choice, but were declined, whose parents can pay a reduced fee at time of service.  °[place]  [facility] [place]  [address], [place] (336) 641-7733 Accepts children up to age 21 who are enrolled in Medicaid or [place] Health Choice; pregnant women with a Medicaid card; and children who have applied for Medicaid or [place] Health Choice, but were declined, whose parents can pay a reduced fee at time of service.  °[facility] ° [address], [place] (336) 641-4533 Patients are seen by appointment only. Walk-ins are not accepted. [facility] will see patients 18 years of age and older. °Monday - Tuesday (8am-5pm) °Most Wednesdays (8:30-5pm) °$30 per visit, cash only  °[facility] ° [address], [place] (336) 641-4533 Patients are seen by appointment only. Walk-ins are not accepted. [facility] will see patients 18 years of age and older. °One   Wednesday Evening (Monthly: Volunteer Based).  $30 per visit, cash only  °[facility]  (919) 537-3737 for adults; Children under age 4, call Graduate Pediatric Dentistry at ([phone]) [phone]. Children aged 4-14, please call (919) [phone] to request a pediatric application. ° Dental services are provided in all areas of dental care including fillings, crowns and bridges, complete and partial dentures, implants, gum treatment, root canals, and extractions. Preventive care is also provided. Treatment is provided to both adults and children. °Patients are selected via a lottery and there is often a waiting list. °  °[facility] [address], °[place] ° (336) 763-8833 [email or web address] °  °[facility] [address], [place], [place] (336)723-1848, Ext. 123 Second and Fourth Thursday of each month, opens at 6:30 AM; Clinic ends at 9 AM.  Patients are seen on a first-come first-served basis, and a limited number are seen during each clinic.  ° °[facility] ° [address], [place], [place] (336) 723-7904    Eligibility Requirements °You must have lived in [place], [place], or [place] counties for at least the last three months. °  You cannot be eligible for state or federal sponsored healthcare insurance, including Veterans Administration, Medicaid, or Medicare. °  You generally cannot be eligible for healthcare insurance through your employer.  °  How to apply: °Eligibility screenings are held every Tuesday and Wednesday afternoon from 1:00 pm until 4:00 pm. You do not need an appointment for the interview!  °[facility] [address], [place], [place] 336-631-2330   °[facility]  336-342-8273   °[facility]  336-703-3100   °[facility]  336-570-6415   ° °Behavioral Health Resources in the Community: °Intensive Outpatient Programs °Organization         Address  Phone  Notes  °[facility] [address], [place], [place] 336-878-6098   °[facility] [address], [place], [place] 336-832-9800   °[facility] [address], [place], [place] ° 336-882-2125   °[facility] [address],  °[place], [place] 1-800-853-5163 or 336-641-4981   °Substance Abuse Resources °Organization         Address  Phone  Notes  °[facility]  336-882-2125   °[facility]  336-784-9470   °[facility]  336-285-9073   °[facility]  336-845-3988   °Residential & Outpatient Substance Abuse Program  1-800-659-3381   °Psychological Services °Organization         Address  Phone  Notes  °[facility]  336- 832-9600   °[facility]  336- 378-7881   °[facility] [address], [place] 1-800-853-5163 or 336-641-4981   ° °Mobile Crisis Teams °Organization         Address  Phone  Notes  °[facility], Mobile Crisis Care Unit  1-877-626-1772   °Assertive °[facility] ° [address]  [place], [place] 336-834-9664   °Sharon DeEsch [address] °[place] 336-554-5454   ° °Self-Help/Support Groups °Organization         Address  Phone             Notes  °[facility] - variety of support groups  336- 373-1402 Call for more information  °Narcotics Anonymous (NA), [facility] [address], °[place]  2 meetings at this location  ° °  Residential Treatment Programs °Organization         Address  Phone  Notes  °[facility] [address],    °[place]  1-866-801-8205   °[facility] ° [address], [place], [place] 704-293-8524   °[facility] [address], [place] 336-845-3988 Admissions: 8am-3pm M-F  °[facility] [address]    °[place], [place] 336-841-1104   °[facility] [address], [place], [place] 336-379-7146   °[facility] [address]  °[place], [place] 336-285-9073   °[facility] - Intensive Outpatient [address], [place], [place] 336-852-3033   °[facility] [address]  °[place], [place] 1-877-615-2722 or 336-784-9470   °[facility] [address] [place], [place] 336-227-7417 Accepts Medicaid  °[facility] [address]  ° [place] 1-800-659-3381 Substance Abuse/Addiction Treatment  ° °[place] Behavioral Health Resources °Organization         Address  Phone  Notes  °[facility]  (888) 581-9988   °Julie Brannon, PhD [address] [place], [place]   (336) 349-5553 or (336) 951-0000   °[facility]   [address] °[place], [place] (336) 349-4454   °[facility] [address], [place], [place] (336) 342-8316 Insurance/Medicaid/sponsorship through [facility]  °[facility] [address]                                    [place], [place] (336) 342-8316 Therapy/tele-psych/case    °[facility] [address]  ° [place], [place] (336) 349-2233    °Dr. Arfeen  (336) 349-4544   °[facility] [place]  [employer] [place] [facility] 1) [address], [place] °2) [address], [place] °3)  [address], [place] (336) 349-3220 °(336) 342-7768 ° °(336) 342-8140   °[place] [facility] (336) 342-1394 or (336) 342-3537 (After Hours)    ° ° °Take over the counter tylenol and ibuprofen (OR excedrin) and benadryl, as directed on packaging, with the prescription given to you today, as needed for headache.  Keep a headache diary, as discussed.  Call your regular medical doctor tomorrow to schedule a follow up appointment within the next 3 days.  Return to the Emergency Department immediately sooner if worsening.  ° °

## 2013-12-08 NOTE — ED Notes (Signed)
Migraine x 3 days with n/v and light/sound sensitivity.

## 2013-12-08 NOTE — ED Provider Notes (Signed)
CSN: [id number]     Arrival date & time 12/08/13  1053 History   First MD Initiated Contact with Patient 12/08/13 1112     Chief Complaint  Patient presents with  . Migraine      HPI Pt was seen at 1120. Per pt, c/o gradual onset and persistence of constant acute flair of his chronic migraine headache for the past 3 days.  Describes the headache as per his usual chronic migraine headache pain pattern for several years.  Denies headache was sudden or maximal in onset or at any time.  Denies visual changes, no focal motor weakness, no tingling/numbness in extremities, no fevers, no neck pain, no rash.      Past Medical History  Diagnosis Date  . Hypertension   . Chronic back pain   . Migraine    Past Surgical History  Procedure Laterality Date  . Wrist surgery  right    History  Substance Use Topics  . Smoking status: Current Every Day Smoker -- 0.50 packs/day    Types: Cigarettes  . Smokeless tobacco: Not on file  . Alcohol Use: Yes     Comment: 1-2 beers every weekend    Review of Systems ROS: Statement: All systems negative except as marked or noted in the HPI; Constitutional: Negative for fever and chills. ; ; Eyes: Negative for eye pain, redness and discharge. ; ; ENMT: Negative for ear pain, hoarseness, nasal congestion, sinus pressure and sore throat. ; ; Cardiovascular: Negative for chest pain, palpitations, diaphoresis, dyspnea and peripheral edema. ; ; Respiratory: Negative for cough, wheezing and stridor. ; ; Gastrointestinal: Negative for nausea, vomiting, diarrhea, abdominal pain, blood in stool, hematemesis, jaundice and rectal bleeding. . ; ; Genitourinary: Negative for dysuria, flank pain and hematuria. ; ; Musculoskeletal: Negative for back pain and neck pain. Negative for swelling and trauma.; ; Skin: Negative for pruritus, rash, abrasions, blisters, bruising and skin lesion.; ; Neuro: +migraine headache. Negative for lightheadedness and neck stiffness. Negative for  weakness, altered level of consciousness , altered mental status, extremity weakness, paresthesias, involuntary movement, seizure and syncope.        Allergies  Hydrocodone and Morphine and related  Home Medications   Prior to Admission medications   Medication Sig Start Date End Date Taking? Authorizing Provider  losartan (COZAAR) 25 MG tablet Take 25 mg by mouth daily.   Yes Historical Provider, MD  Multiple Vitamins-Minerals (MULTIVITAMINS THER. W/MINERALS) TABS tablet Take 1 tablet by mouth daily.   Yes Historical Provider, MD  metoCLOPramide (REGLAN) 10 MG tablet Take 1 tablet (10 mg total) by mouth every 6 (six) hours as needed for nausea (for headache or nausea). 12/08/13   Laray Anger, DO   BP 148/102  Pulse 60  Temp(Src) 98.5 F (36.9 C) (Oral)  Resp 18  SpO2 98% Physical Exam 1125: Physical examination:  Nursing notes reviewed; Vital signs and O2 SAT reviewed;  Constitutional: Well developed, Well nourished, Well hydrated, In no acute distress; Head:  Normocephalic, atraumatic; Eyes: EOMI, PERRL, No scleral icterus; ENMT: TM's clear bilat. Mouth and pharynx normal, Mucous membranes moist; Neck: Supple, Full range of motion, No lymphadenopathy; Cardiovascular: Regular rate and rhythm, No murmur, rub, or gallop; Respiratory: Breath sounds clear & equal bilaterally, No rales, rhonchi, wheezes.  Speaking full sentences with ease, Normal respiratory effort/excursion; Chest: Nontender, Movement normal; Abdomen: Soft, Nontender, Nondistended, Normal bowel sounds; Genitourinary: No CVA tenderness; Extremities: Pulses normal, No tenderness, No edema, No calf edema or asymmetry.; Neuro: AA&Ox3,  Major CN grossly intact.  Speech clear. No gross focal motor or sensory deficits in extremities. Climbs on and off stretcher easily by himself. Gait steady.; Skin: Color normal, Warm, Dry.   ED Course  Procedures    MDM  MDM Reviewed: previous chart, nursing note and  vitals     1330:  Feels improved after meds and wants to go home now. Dx d/w pt.  Questions answered.  Verb understanding, agreeable to d/c home with outpt f/u.   Laray AngerKathleen M Alydia Gosser, DO 12/10/13 1132

## 2014-03-30 ENCOUNTER — Emergency Department (HOSPITAL_COMMUNITY): Payer: Self-pay

## 2014-03-30 ENCOUNTER — Emergency Department (HOSPITAL_COMMUNITY)
Admission: EM | Admit: 2014-03-30 | Discharge: 2014-03-30 | Disposition: A | Discharge status: Home / Self Care | Payer: Self-pay | Attending: Emergency Medicine | Admitting: Emergency Medicine

## 2014-03-30 ENCOUNTER — Encounter (HOSPITAL_COMMUNITY): Payer: Self-pay | Admitting: Emergency Medicine

## 2014-03-30 DIAGNOSIS — G43909 Migraine, unspecified, not intractable, without status migrainosus: Secondary | ICD-10-CM | POA: Insufficient documentation

## 2014-03-30 DIAGNOSIS — F17200 Nicotine dependence, unspecified, uncomplicated: Secondary | ICD-10-CM | POA: Insufficient documentation

## 2014-03-30 DIAGNOSIS — G8929 Other chronic pain: Secondary | ICD-10-CM | POA: Insufficient documentation

## 2014-03-30 DIAGNOSIS — G43009 Migraine without aura, not intractable, without status migrainosus: Secondary | ICD-10-CM

## 2014-03-30 DIAGNOSIS — I10 Essential (primary) hypertension: Secondary | ICD-10-CM | POA: Insufficient documentation

## 2014-03-30 DIAGNOSIS — Z760 Encounter for issue of repeat prescription: Secondary | ICD-10-CM | POA: Insufficient documentation

## 2014-03-30 DIAGNOSIS — G43109 Migraine with aura, not intractable, without status migrainosus: Secondary | ICD-10-CM | POA: Insufficient documentation

## 2014-03-30 MED ORDER — DEXAMETHASONE SODIUM PHOSPHATE 10 MG/ML IJ SOLN
10.0000 mg | Freq: Once | INTRAMUSCULAR | Status: AC
  Administered 2014-03-30: 10 mg via INTRAVENOUS
  Filled 2014-03-30: qty 1

## 2014-03-30 MED ORDER — SODIUM CHLORIDE 0.9 % IV BOLUS (SEPSIS)
500.0000 mL | Freq: Once | INTRAVENOUS | Status: AC
  Administered 2014-03-30: 500 mL via INTRAVENOUS

## 2014-03-30 MED ORDER — OXYCODONE-ACETAMINOPHEN 5-325 MG PO TABS: 1.0000 | ORAL_TABLET | ORAL | Status: DC | PRN

## 2014-03-30 MED ORDER — DIPHENHYDRAMINE HCL 50 MG/ML IJ SOLN
50.0000 mg | Freq: Once | INTRAMUSCULAR | Status: AC
  Administered 2014-03-30: 50 mg via INTRAVENOUS
  Filled 2014-03-30: qty 1

## 2014-03-30 MED ORDER — HYDROMORPHONE HCL 1 MG/ML IJ SOLN
0.5000 mg | Freq: Once | INTRAMUSCULAR | Status: AC
  Administered 2014-03-30: 0.5 mg via INTRAVENOUS
  Filled 2014-03-30: qty 1

## 2014-03-30 MED ORDER — METOCLOPRAMIDE HCL 5 MG/ML IJ SOLN
10.0000 mg | Freq: Once | INTRAMUSCULAR | Status: AC
  Administered 2014-03-30: 10 mg via INTRAVENOUS
  Filled 2014-03-30: qty 2

## 2014-03-30 MED ORDER — HYDROCHLOROTHIAZIDE 12.5 MG PO TABS
25.0000 mg | ORAL_TABLET | Freq: Every day | ORAL | Status: DC
Start: 2014-03-30 — End: 2015-05-29

## 2014-03-30 NOTE — Discharge Instructions (Signed)
Migraine Headache A migraine headache is an intense, throbbing pain on one or both sides of your head. A migraine can last for 30 minutes to several hours. CAUSES  The exact cause of a migraine headache is not always known. However, a migraine may be caused when nerves in the brain become irritated and release chemicals that cause inflammation. This causes pain. Certain things may also trigger migraines, such as:  Alcohol.  Smoking.  Stress.  Menstruation.  Aged cheeses.  Foods or drinks that contain nitrates, glutamate, aspartame, or tyramine.  Lack of sleep.  Chocolate.  Caffeine.  Hunger.  Physical exertion.  Fatigue.  Medicines used to treat chest pain (nitroglycerine), birth control pills, estrogen, and some blood pressure medicines. SIGNS AND SYMPTOMS  Pain on one or both sides of your head.  Pulsating or throbbing pain.  Severe pain that prevents daily activities.  Pain that is aggravated by any physical activity.  Nausea, vomiting, or both.  Dizziness.  Pain with exposure to bright lights, loud noises, or activity.  General sensitivity to bright lights, loud noises, or smells. Before you get a migraine, you may get warning signs that a migraine is coming (aura). An aura may include:  Seeing flashing lights.  Seeing bright spots, halos, or zigzag lines.  Having tunnel vision or blurred vision.  Having feelings of numbness or tingling.  Having trouble talking.  Having muscle weakness. DIAGNOSIS  A migraine headache is often diagnosed based on:  Symptoms.  Physical exam.  A CT scan or MRI of your head. These imaging tests cannot diagnose migraines, but they can help rule out other causes of headaches. TREATMENT Medicines may be given for pain and nausea. Medicines can also be given to help prevent recurrent migraines.  HOME CARE INSTRUCTIONS  Only take over-the-counter or prescription medicines for pain or discomfort as directed by your  health care provider. The use of long-term narcotics is not recommended.  Lie down in a dark, quiet room when you have a migraine.  Keep a journal to find out what may trigger your migraine headaches. For example, write down:  What you eat and drink.  How much sleep you get.  Any change to your diet or medicines.  Limit alcohol consumption.  Quit smoking if you smoke.  Get 7-9 hours of sleep, or as recommended by your health care provider.  Limit stress.  Keep lights dim if bright lights bother you and make your migraines worse. SEEK IMMEDIATE MEDICAL CARE IF:   Your migraine becomes severe.  You have a fever.  You have a stiff neck.  You have vision loss.  You have muscular weakness or loss of muscle control.  You start losing your balance or have trouble walking.  You feel faint or pass out.  You have severe symptoms that are different from your first symptoms. MAKE SURE YOU:   Understand these instructions.  Will watch your condition.  Will get help right away if you are not doing well or get worse. Document Released: 06/20/2005 Document Revised: 11/04/2013 Document Reviewed: 02/25/2013 [facility] Patient Information 2015 [place], [place]. This information is not intended to replace advice given to you by your health care provider. Make sure you discuss any questions you have with your health care provider.  Hypertension Hypertension, commonly called high blood pressure, is when the force of blood pumping through your arteries is too strong. Your arteries are the blood vessels that carry blood from your heart throughout your body. A blood pressure reading  consists of a higher number over a lower number, such as 110/72. The higher number (systolic) is the pressure inside your arteries when your heart pumps. The lower number (diastolic) is the pressure inside your arteries when your heart relaxes. Ideally you want your blood pressure below 120/80. Hypertension forces  your heart to work harder to pump blood. Your arteries may become narrow or stiff. Having hypertension puts you at risk for heart disease, stroke, and other problems.  RISK FACTORS Some risk factors for high blood pressure are controllable. Others are not.  Risk factors you cannot control include:   Race. You may be at higher risk if you are African American.  Age. Risk increases with age.  Gender. Men are at higher risk than women before age 18 years. After age 76, women are at higher risk than men. Risk factors you can control include:  Not getting enough exercise or physical activity.  Being overweight.  Getting too much fat, sugar, calories, or salt in your diet.  Drinking too much alcohol. SIGNS AND SYMPTOMS Hypertension does not usually cause signs or symptoms. Extremely high blood pressure (hypertensive crisis) may cause headache, anxiety, shortness of breath, and nosebleed. DIAGNOSIS  To check if you have hypertension, your health care provider will measure your blood pressure while you are seated, with your arm held at the level of your heart. It should be measured at least twice using the same arm. Certain conditions can cause a difference in blood pressure between your right and left arms. A blood pressure reading that is higher than normal on one occasion does not mean that you need treatment. If one blood pressure reading is high, ask your health care provider about having it checked again. TREATMENT  Treating high blood pressure includes making lifestyle changes and possibly taking medicine. Living a healthy lifestyle can help lower high blood pressure. You may need to change some of your habits. Lifestyle changes may include:  Following the DASH diet. This diet is high in fruits, vegetables, and whole grains. It is low in salt, red meat, and added sugars.  Getting at least 2 hours of brisk physical activity every week.  Losing weight if necessary.  Not  smoking.  Limiting alcoholic beverages.  Learning ways to reduce stress. If lifestyle changes are not enough to get your blood pressure under control, your health care provider may prescribe medicine. You may need to take more than one. Work closely with your health care provider to understand the risks and benefits. HOME CARE INSTRUCTIONS  Have your blood pressure rechecked as directed by your health care provider.   Take medicines only as directed by your health care provider. Follow the directions carefully. Blood pressure medicines must be taken as prescribed. The medicine does not work as well when you skip doses. Skipping doses also puts you at risk for problems.   Do not smoke.   Monitor your blood pressure at home as directed by your health care provider. SEEK MEDICAL CARE IF:   You think you are having a reaction to medicines taken.  You have recurrent headaches or feel dizzy.  You have swelling in your ankles.  You have trouble with your vision. SEEK IMMEDIATE MEDICAL CARE IF:  You develop a severe headache or confusion.  You have unusual weakness, numbness, or feel faint.  You have severe chest or abdominal pain.  You vomit repeatedly.  You have trouble breathing. MAKE SURE YOU:   Understand these instructions.  Will watch your  condition.  Will get help right away if you are not doing well or get worse. Document Released: 06/20/2005 Document Revised: 11/04/2013 Document Reviewed: 04/12/2013 [facility] Patient Information 2015 [place], [place]. This information is not intended to replace advice given to you by your health care provider. Make sure you discuss any questions you have with your health care provider.

## 2014-03-30 NOTE — ED Notes (Signed)
Patient states he got up and walked and pain went up to a 2/10

## 2014-03-30 NOTE — ED Provider Notes (Signed)
CSN: [id number]     Arrival date & time 03/30/14  9562 History   First MD Initiated Contact with Patient 03/30/14 0848     Chief Complaint  Patient presents with  . Migraine     (Consider location/radiation/quality/duration/timing/severity/associated sxs/prior Treatment) The history is provided by the patient.   Corey [name] is a 33 y.o. male presenting with migraine headache which started 2 days ago.  He has a history of migraine and the current symptoms are similar to previous episodes of migraine headache.  The patients symptoms were not  preceded by prodromal symptoms, flashing lights, visual scotoma.  The patient has bilateral head ache currently which started behind his right eye, its typical location and is associated with nausea with emesis x 1 this am,  phono and photophobia.  There has been no visual changes, numbness, fevers, chills, syncope, dizziness, confusion or localized weakness.  The patient has tried tylenol and excedrin migraine without relief of symptoms.      Past Medical History  Diagnosis Date  . Hypertension   . Chronic back pain   . Migraine    Past Surgical History  Procedure Laterality Date  . Wrist surgery  right   No family history on file. History  Substance Use Topics  . Smoking status: Current Every Day Smoker -- 0.50 packs/day    Types: Cigarettes  . Smokeless tobacco: Not on file  . Alcohol Use: No     Comment: 1-2 beers every weekend    Review of Systems  Constitutional: Negative for fever.  HENT: Negative for congestion and sore throat.   Eyes: Negative.   Respiratory: Negative for chest tightness and shortness of breath.   Cardiovascular: Negative for chest pain.  Gastrointestinal: Negative for nausea and abdominal pain.  Genitourinary: Negative.   Musculoskeletal: Negative for arthralgias, joint swelling and neck pain.  Skin: Negative.  Negative for rash and wound.  Neurological: Positive for headaches. Negative for dizziness,  speech difficulty, weakness, light-headedness and numbness.  Psychiatric/Behavioral: Negative.       Allergies  Hydrocodone and Morphine and related  Home Medications   Prior to Admission medications   Medication Sig Start Date End Date Taking? Authorizing Provider  hydrochlorothiazide (HYDRODIURIL) 12.5 MG tablet Take 2 tablets (25 mg total) by mouth daily. 03/30/14   Burgess Amor, PA-C  oxyCODONE-acetaminophen (PERCOCET/ROXICET) 5-325 MG per tablet Take 1 tablet by mouth every 4 (four) hours as needed. 03/30/14   Burgess Amor, PA-C   BP 172/82  Pulse 57  Temp(Src) 97.8 F (36.6 C) (Oral)  Resp 17  Ht  (1.88 m)  SpO2 97% Physical Exam  Nursing note and vitals reviewed. Constitutional: He is oriented to person, place, and time. He appears well-developed and well-nourished.  Uncomfortable appearing  HENT:  Head: Normocephalic and atraumatic.  Mouth/Throat: Oropharynx is clear and moist.  Eyes: EOM are normal. Pupils are equal, round, and reactive to light.  Neck: Normal range of motion. Neck supple.  Cardiovascular: Normal rate and normal heart sounds.   Pulmonary/Chest: Effort normal.  Abdominal: Soft. There is no tenderness.  Musculoskeletal: Normal range of motion.  Lymphadenopathy:    He has no cervical adenopathy.  Neurological: He is alert and oriented to person, place, and time. He has normal strength. No sensory deficit. Gait normal. GCS eye subscore is 4. GCS verbal subscore is 5. GCS motor subscore is 6.  Normal rapid alternating movements. Cranial nerves III-XII intact.  No pronator drift.  Skin: Skin is warm and  dry. No rash noted.  Psychiatric: He has a normal mood and affect. His speech is normal and behavior is normal. Thought content normal. Cognition and memory are normal.    ED Course  Procedures (including critical care time) Labs Review Labs Reviewed - No data to display  Imaging Review Ct Head Wo Contrast  03/30/2014   CLINICAL DATA:  Severe  migraine.  Pain all over this morning.  EXAM: CT HEAD WITHOUT CONTRAST  TECHNIQUE: Contiguous axial images were obtained from the base of the skull through the vertex without intravenous contrast.  COMPARISON:  12/12/2008  FINDINGS: There is no intra or extra-axial fluid collection or mass lesion. The basilar cisterns and ventricles have a normal appearance. There is no CT evidence for acute infarction or hemorrhage. Bone windows are unremarkable.  IMPRESSION: No evidence for acute intracranial abnormality.   Electronically Signed   By: Rosalie Gums M.D.   On: 03/30/2014 11:57     EKG Interpretation None      MDM   Final diagnoses:  Migraine without aura and without status migrainosus, not intractable  Essential hypertension  Medication refill    Pt given 500 cc bolus along with IV dosing of Reglan, Decadron and Benadryl, h/a reduced to 8/10.  Added dilaudid 0.5 mg IV with headache 2/10.  Ct reviewed, negative for intracranial process.    BP improved to 152/85.  BP trend below.  Vitals - 1 value per visit 03/30/2014 12/08/2013 03/12/2013 12/10/2011 05/26/2011  SYSTOLIC 167 148 161 150 153  DIASTOLIC 119 102 89 95 90     Pt states he ran out of his losartan 1 week ago, but is not interested in continuing this medicine as it causes problems with erections.  He is scheduled to see his primary doctor in 2 weeks but is desirous of trying a new bp medicine.  He has taken beta blockers and lisinopril in the past which also caused similar side effects.  He was given prescription for hctz, further management of bp per pcp.  Prescribed oxycodone for headache pain relief if sx persist.  Burgess Amor, PA-C 03/30/14 2021

## 2014-03-30 NOTE — ED Notes (Addendum)
Patient states he has had a migraine for the last two day. Patient states he has a history of margines. Patient states it radiates down his neck and that the pain worsens with light, sound, and movement. Patient rates pain 10/10 patient states . " it feel like something in my head is trying to get out."   Patient states he has vomited x1

## 2014-04-03 NOTE — ED Provider Notes (Signed)
Medical screening examination/treatment/procedure(s) were performed by non-physician practitioner and as supervising physician I was immediately available for consultation/collaboration.   EKG Interpretation None      Zan Triska, MD, FACEP   Taksh Hjort L Boe Deans, MD 04/03/14 1502 

## 2014-12-06 ENCOUNTER — Emergency Department (HOSPITAL_COMMUNITY)
Admission: EM | Admit: 2014-12-06 | Discharge: 2014-12-06 | Disposition: A | Discharge status: Home / Self Care | Payer: Self-pay | Attending: Emergency Medicine | Admitting: Emergency Medicine

## 2014-12-06 ENCOUNTER — Encounter (HOSPITAL_COMMUNITY): Payer: Self-pay | Admitting: *Deleted

## 2014-12-06 DIAGNOSIS — M549 Dorsalgia, unspecified: Secondary | ICD-10-CM | POA: Insufficient documentation

## 2014-12-06 DIAGNOSIS — I10 Essential (primary) hypertension: Secondary | ICD-10-CM | POA: Insufficient documentation

## 2014-12-06 DIAGNOSIS — K0889 Other specified disorders of teeth and supporting structures: Secondary | ICD-10-CM

## 2014-12-06 DIAGNOSIS — Z79899 Other long term (current) drug therapy: Secondary | ICD-10-CM | POA: Insufficient documentation

## 2014-12-06 DIAGNOSIS — K088 Other specified disorders of teeth and supporting structures: Secondary | ICD-10-CM | POA: Insufficient documentation

## 2014-12-06 DIAGNOSIS — Z720 Tobacco use: Secondary | ICD-10-CM | POA: Insufficient documentation

## 2014-12-06 DIAGNOSIS — G8929 Other chronic pain: Secondary | ICD-10-CM | POA: Insufficient documentation

## 2014-12-06 DIAGNOSIS — K029 Dental caries, unspecified: Secondary | ICD-10-CM | POA: Insufficient documentation

## 2014-12-06 MED ORDER — IBUPROFEN 800 MG PO TABS: 800.0000 mg | ORAL_TABLET | Freq: Three times a day (TID) | ORAL | Status: DC

## 2014-12-06 MED ORDER — AMOXICILLIN 500 MG PO CAPS: 500.0000 mg | ORAL_CAPSULE | Freq: Three times a day (TID) | ORAL | Status: DC

## 2014-12-06 MED ORDER — ACETAMINOPHEN-CODEINE #3 300-30 MG PO TABS
2.0000 | ORAL_TABLET | Freq: Once | ORAL | Status: AC
  Administered 2014-12-06: 2 via ORAL
  Filled 2014-12-06: qty 2

## 2014-12-06 MED ORDER — AMOXICILLIN 250 MG PO CAPS
500.0000 mg | ORAL_CAPSULE | Freq: Once | ORAL | Status: AC
  Administered 2014-12-06: 500 mg via ORAL
  Filled 2014-12-06: qty 2

## 2014-12-06 MED ORDER — ACETAMINOPHEN-CODEINE #3 300-30 MG PO TABS: 1.0000 | ORAL_TABLET | Freq: Four times a day (QID) | ORAL | Status: DC | PRN

## 2014-12-06 MED ORDER — DIPHENHYDRAMINE HCL 25 MG PO CAPS
25.0000 mg | ORAL_CAPSULE | Freq: Once | ORAL | Status: AC
  Administered 2014-12-06: 25 mg via ORAL
  Filled 2014-12-06: qty 1

## 2014-12-06 MED ORDER — IBUPROFEN 800 MG PO TABS
800.0000 mg | ORAL_TABLET | Freq: Once | ORAL | Status: AC
  Administered 2014-12-06: 800 mg via ORAL
  Filled 2014-12-06: qty 1

## 2014-12-06 MED ORDER — DIPHENHYDRAMINE HCL 25 MG PO TABS: 25.0000 mg | ORAL_TABLET | Freq: Four times a day (QID) | ORAL | Status: DC

## 2014-12-06 NOTE — Discharge Instructions (Signed)
It is important that you see a dentist as soon as possible. Please use amoxil and ibuprofen three times daily with food until all taken. Please use tylenol codeine for more severe pain. Use benadryl for itching if needed. Tylenol Codeine and benadryl may cause drowsiness, use with caution. Dental Pain A tooth ache may be caused by cavities (tooth decay). Cavities expose the nerve of the tooth to air and hot or cold temperatures. It may come from an infection or abscess (also called a boil or furuncle) around your tooth. It is also often caused by dental caries (tooth decay). This causes the pain you are having. DIAGNOSIS  Your caregiver can diagnose this problem by exam. TREATMENT   If caused by an infection, it may be treated with medications which kill germs (antibiotics) and pain medications as prescribed by your caregiver. Take medications as directed.  Only take over-the-counter or prescription medicines for pain, discomfort, or fever as directed by your caregiver.  Whether the tooth ache today is caused by infection or dental disease, you should see your dentist as soon as possible for further care. SEEK MEDICAL CARE IF: The exam and treatment you received today has been provided on an emergency basis only. This is not a substitute for complete medical or dental care. If your problem worsens or new problems (symptoms) appear, and you are unable to meet with your dentist, call or return to this location. SEEK IMMEDIATE MEDICAL CARE IF:   You have a fever.  You develop redness and swelling of your face, jaw, or neck.  You are unable to open your mouth.  You have severe pain uncontrolled by pain medicine. MAKE SURE YOU:   Understand these instructions.  Will watch your condition.  Will get help right away if you are not doing well or get worse. Document Released: 06/20/2005 Document Revised: 09/12/2011 Document Reviewed: 02/06/2008 [facility] Patient Information 2015 [place].  This information is not intended to replace advice given to you by your health care provider. Make sure you discuss any questions you have with your health care provider.

## 2014-12-06 NOTE — ED Notes (Signed)
Pt states filling fell 3 days ago and he now has an exposed never which is causing pain, per pt.

## 2014-12-06 NOTE — ED Provider Notes (Signed)
CSN: [id number]     Arrival date & time 12/06/14  1603 History   First MD Initiated Contact with Patient 12/06/14 1640     Chief Complaint  Patient presents with  . Dental Pain     (Consider location/radiation/quality/duration/timing/severity/associated sxs/prior Treatment) Patient is a 34 y.o. male presenting with tooth pain. The history is provided by the patient.  Dental Pain Location:  Upper Upper teeth location:  15/LU 2nd molar Quality:  Throbbing Severity:  Severe Onset quality:  Gradual Duration:  3 days Timing:  Intermittent Progression:  Worsening Context: dental caries   Relieved by:  Nothing Worsened by:  Cold food/drink Associated symptoms: facial pain and headaches   Risk factors: smoking   Risk factors: no immunosuppression     Past Medical History  Diagnosis Date  . Hypertension   . Chronic back pain   . Migraine    Past Surgical History  Procedure Laterality Date  . Wrist surgery  right   No family history on file. History  Substance Use Topics  . Smoking status: Current Every Day Smoker -- 0.50 packs/day    Types: Cigarettes  . Smokeless tobacco: Not on file  . Alcohol Use: No     Comment: 1-2 beers every weekend    Review of Systems  HENT: Positive for dental problem.   Musculoskeletal: Positive for back pain.  Neurological: Positive for headaches.  All other systems reviewed and are negative.     Allergies  Hydrocodone and Morphine and related  Home Medications   Prior to Admission medications   Medication Sig Start Date End Date Taking? Authorizing Provider  ibuprofen (ADVIL,MOTRIN) 200 MG tablet Take 200 mg by mouth every 6 (six) hours as needed.   Yes Historical Provider, MD  hydrochlorothiazide (HYDRODIURIL) 12.5 MG tablet Take 2 tablets (25 mg total) by mouth daily. Patient not taking: Reported on 12/06/2014 03/30/14   Burgess AmorJulie Idol, PA-C  oxyCODONE-acetaminophen (PERCOCET/ROXICET) 5-325 MG per tablet Take 1 tablet by mouth every 4  (four) hours as needed. Patient not taking: Reported on 12/06/2014 03/30/14   Burgess AmorJulie Idol, PA-C   BP 162/107 mmHg  Pulse 104  Temp(Src) 99.2 F (37.3 C) (Oral)  Resp 24  SpO2 96% Physical Exam  Constitutional: He is oriented to person, place, and time. He appears well-developed and well-nourished.  Non-toxic appearance.  HENT:  Head: Normocephalic.  Right Ear: Tympanic membrane and external ear normal.  Left Ear: Tympanic membrane and external ear normal.  A piece of filling is missing from the left upper molar. No abscess, mild gum swelling present. Airway patent. No swelling under the tongue.  Eyes: EOM and lids are normal. Pupils are equal, round, and reactive to light.  Neck: Normal range of motion. Neck supple. Carotid bruit is not present.  Cardiovascular: Normal rate, regular rhythm, normal heart sounds, intact distal pulses and normal pulses.   Pulmonary/Chest: Breath sounds normal. No respiratory distress.  Abdominal: Soft. Bowel sounds are normal. There is no tenderness. There is no guarding.  Musculoskeletal: Normal range of motion.  Lymphadenopathy:       Head (right side): No submandibular adenopathy present.       Head (left side): No submandibular adenopathy present.    He has no cervical adenopathy.  Neurological: He is alert and oriented to person, place, and time. He has normal strength. No cranial nerve deficit or sensory deficit.  Skin: Skin is warm and dry.  Psychiatric: He has a normal mood and affect. His speech is normal.  Nursing note and vitals reviewed.   ED Course  Procedures (including critical care time) Labs Review Labs Reviewed - No data to display  Imaging Review No results found.   EKG Interpretation None      MDM  No abscess noted. Airway patent. No evidence for Ludwig's angina. Pt encouraged to see a dentist as soon as possible. Rx for amoxil, ibuprofen and tylenol codeine and benadryl given to the patient. He will return if any changes  or problem.   Final diagnoses:  None    *I have reviewed nursing notes, vital signs, and all appropriate lab and imaging results for this patient.[id number] PA-C 12/06/14 1659  Rolland Porter, MD 12/17/14 [phone]

## 2015-01-24 ENCOUNTER — Encounter (HOSPITAL_COMMUNITY): Payer: Self-pay | Admitting: *Deleted

## 2015-01-24 ENCOUNTER — Emergency Department (HOSPITAL_COMMUNITY)
Admission: EM | Admit: 2015-01-24 | Discharge: 2015-01-24 | Disposition: A | Discharge status: Home / Self Care | Payer: Self-pay | Attending: Emergency Medicine | Admitting: Emergency Medicine

## 2015-01-24 DIAGNOSIS — Z87891 Personal history of nicotine dependence: Secondary | ICD-10-CM | POA: Insufficient documentation

## 2015-01-24 DIAGNOSIS — K088 Other specified disorders of teeth and supporting structures: Secondary | ICD-10-CM | POA: Insufficient documentation

## 2015-01-24 DIAGNOSIS — I10 Essential (primary) hypertension: Secondary | ICD-10-CM | POA: Insufficient documentation

## 2015-01-24 DIAGNOSIS — Z792 Long term (current) use of antibiotics: Secondary | ICD-10-CM | POA: Insufficient documentation

## 2015-01-24 DIAGNOSIS — Z791 Long term (current) use of non-steroidal anti-inflammatories (NSAID): Secondary | ICD-10-CM | POA: Insufficient documentation

## 2015-01-24 DIAGNOSIS — G8929 Other chronic pain: Secondary | ICD-10-CM | POA: Insufficient documentation

## 2015-01-24 DIAGNOSIS — K0889 Other specified disorders of teeth and supporting structures: Secondary | ICD-10-CM

## 2015-01-24 MED ORDER — OXYCODONE HCL 5 MG PO TABS
5.0000 mg | ORAL_TABLET | Freq: Once | ORAL | Status: AC
Start: 2015-01-24 — End: 2015-01-24
  Administered 2015-01-24: 5 mg via ORAL
  Filled 2015-01-24: qty 1

## 2015-01-24 MED ORDER — IBUPROFEN 800 MG PO TABS: 800.0000 mg | ORAL_TABLET | Freq: Three times a day (TID) | ORAL | Status: DC

## 2015-01-24 MED ORDER — IBUPROFEN 800 MG PO TABS
800.0000 mg | ORAL_TABLET | Freq: Once | ORAL | Status: AC
  Administered 2015-01-24: 800 mg via ORAL
  Filled 2015-01-24: qty 1

## 2015-01-24 NOTE — ED Notes (Signed)
Two day hx of upper L frontal dental pain.  Filling fell out and he thinks it cracked.

## 2015-01-24 NOTE — ED Provider Notes (Signed)
CSN: [id number]     Arrival date & time 01/24/15  1213 History   This chart was scribed for Corey Memos, MD by Murriel Hopper, ED Scribe. This patient was seen in room APFT23/APFT23 and the patient's care was started at 12:27 PM.  Chief Complaint  Patient presents with  . Dental Pain     Patient is a 34 y.o. male presenting with tooth pain. The history is provided by the patient. No language interpreter was used.  Dental Pain Location:  Upper Quality:  Aching Severity:  Moderate Onset quality:  Sudden Duration:  2 days Timing:  Constant Progression:  Worsening Chronicity:  New Context: filling fell out   Relieved by:  Nothing Ineffective treatments:  NSAIDs and topical anesthetic gel Associated symptoms: no fever      HPI Comments: [name] is a 34 y.o. male who presents to the Emergency Department complaining of constant left-sided maxillary dental pain that has been present for 3 days after a filling fell out. Pt states he tried to place a temporary filling in the tooth but was unsuccessful. Pt states he has tried using eucalyptus oil, Orajel, Ibuprofen with little relief. Pt states he has not seen a dentist for his pain.   Past Medical History  Diagnosis Date  . Hypertension   . Chronic back pain   . Migraine    Past Surgical History  Procedure Laterality Date  . Wrist surgery  right   History reviewed. No pertinent family history. History  Substance Use Topics  . Smoking status: Former Smoker -- 0.50 packs/day    Types: Cigarettes  . Smokeless tobacco: Not on file  . Alcohol Use: No     Comment: 1-2 beers every weekend    Review of Systems  Constitutional: Negative for fever and chills.  HENT: Positive for dental problem. Negative for sore throat.   Eyes: Negative for pain and redness.  Musculoskeletal: Negative for joint swelling and gait problem.      Allergies  Hydrocodone and Morphine and related  Home Medications   Prior to Admission  medications   Medication Sig Start Date End Date Taking? Authorizing Provider  acetaminophen-codeine (TYLENOL #3) 300-30 MG per tablet Take 1-2 tablets by mouth every 6 (six) hours as needed. 12/06/14   Ivery Quale, PA-C  amoxicillin (AMOXIL) 500 MG capsule Take 1 capsule (500 mg total) by mouth 3 (three) times daily. 12/06/14   Ivery Quale, PA-C  diphenhydrAMINE (BENADRYL) 25 MG tablet Take 1 tablet (25 mg total) by mouth every 6 (six) hours. Use if needed for itching. 12/06/14   Ivery Quale, PA-C  hydrochlorothiazide (HYDRODIURIL) 12.5 MG tablet Take 2 tablets (25 mg total) by mouth daily. Patient not taking: Reported on 12/06/2014 03/30/14   Burgess Amor, PA-C  ibuprofen (ADVIL,MOTRIN) 800 MG tablet Take 1 tablet (800 mg total) by mouth 3 (three) times daily. 12/06/14   Ivery Quale, PA-C  oxyCODONE-acetaminophen (PERCOCET/ROXICET) 5-325 MG per tablet Take 1 tablet by mouth every 4 (four) hours as needed. Patient not taking: Reported on 12/06/2014 03/30/14   Burgess Amor, PA-C   BP 168/102 mmHg  Pulse 75  Temp(Src) 98.4 F (36.9 C) (Oral)  Ht  (1.905 m)  Wt 275 lb (124.739 kg)  BMI 34.37 kg/m2  SpO2 97% Physical Exam  Constitutional: He appears well-developed and well-nourished.  HENT:  Head: Normocephalic and atraumatic.  No trismus  No swelling No loose teeth No drainage  No erythema No tenderness of gums  Cardiovascular: Normal  rate and regular rhythm.   Nursing note and vitals reviewed.   ED Course  Dental Date/Time: 01/24/2015 12:52 PM Performed by: Corey [name] Authorized by: Corey [name] Consent: Verbal consent obtained. Consent given by: patient Patient understanding: patient states understanding of the procedure being performed Local anesthesia used: yes Local anesthetic: bupivacaine 0.5% with epinephrine Anesthetic total: 1 ml Patient tolerance: Patient tolerated the procedure well with no immediate complications Comments: L Infraorbital nerve block performed with  near total relief of symptoms immediately.    (including critical care time)  DIAGNOSTIC STUDIES: Oxygen Saturation is 97% on room air, normal by my interpretation.    COORDINATION OF CARE: 12:30 PM Discussed treatment plan with pt at bedside and pt agreed to plan.   Labs Review Labs Reviewed - No data to display  Imaging Review No results found.   EKG Interpretation None      MDM   Final diagnoses:  Pain, dental      Pt is a 33yo M without pertinent PMHX who presents w/ dental pain 2/2 attempting to put in his own filling in left upper canine.   Ddx for atraumatic dental pain includes dental caries, periapical/peridontal abscess, acute necrotizing ulcerative gingivitis, alveolar osteitis however none of these seem to present. No new fracture either. Treated with dental block as above with near relief of symptoms.   Patient given oral pain medications while in ED. Dental block was performed.  Patient discharged in stable condition with instructions to follow-up 48hours. Information for local low-cost clinics provided. Strict return precautions given. I personally performed the services described in this documentation, which was scribed in my presence. The recorded information has been reviewed and is accurate.      Corey Memos, MD 01/24/15 1309

## 2015-02-10 ENCOUNTER — Encounter (HOSPITAL_COMMUNITY): Payer: Self-pay | Admitting: Emergency Medicine

## 2015-02-10 ENCOUNTER — Emergency Department (HOSPITAL_COMMUNITY)
Admission: EM | Admit: 2015-02-10 | Discharge: 2015-02-10 | Disposition: A | Discharge status: Home / Self Care | Payer: Self-pay | Attending: Emergency Medicine | Admitting: Emergency Medicine

## 2015-02-10 DIAGNOSIS — Z8679 Personal history of other diseases of the circulatory system: Secondary | ICD-10-CM | POA: Insufficient documentation

## 2015-02-10 DIAGNOSIS — K0889 Other specified disorders of teeth and supporting structures: Secondary | ICD-10-CM

## 2015-02-10 DIAGNOSIS — G8929 Other chronic pain: Secondary | ICD-10-CM | POA: Insufficient documentation

## 2015-02-10 DIAGNOSIS — Z79899 Other long term (current) drug therapy: Secondary | ICD-10-CM | POA: Insufficient documentation

## 2015-02-10 DIAGNOSIS — K088 Other specified disorders of teeth and supporting structures: Secondary | ICD-10-CM | POA: Insufficient documentation

## 2015-02-10 DIAGNOSIS — I10 Essential (primary) hypertension: Secondary | ICD-10-CM | POA: Insufficient documentation

## 2015-02-10 DIAGNOSIS — Z87891 Personal history of nicotine dependence: Secondary | ICD-10-CM | POA: Insufficient documentation

## 2015-02-10 MED ORDER — NAPROXEN 500 MG PO TABS: 500.0000 mg | ORAL_TABLET | Freq: Two times a day (BID) | ORAL | Status: DC

## 2015-02-10 MED ORDER — LIDOCAINE VISCOUS 2 % MT SOLN: OROMUCOSAL | Status: DC

## 2015-02-10 MED ORDER — PENICILLIN V POTASSIUM 500 MG PO TABS: 500.0000 mg | ORAL_TABLET | Freq: Four times a day (QID) | ORAL | Status: DC

## 2015-02-10 NOTE — ED Notes (Signed)
Pt c/o dental pain x 1 month. States was seen 2 weeks ago and given Motrin.

## 2015-02-10 NOTE — ED Provider Notes (Signed)
CSN: [id number]     Arrival date & time 02/10/15  0959 History  This chart was scribed for non-physician practitioner, Sharilyn Sites, PA-C, working with Gwyneth Sprout, MD by Charline Bills, ED Scribe. This patient was seen in room TR07C/TR07C and the patient's care was started at 10:53 AM.   Chief Complaint  Patient presents with  . Dental Pain   The history is provided by the patient. No language interpreter was used.   HPI Comments: Corey [name] is a 34 y.o. male, with a h/o HTN, who presents to the Emergency Department complaining of constant, gradually worsening left upper dental pain for the past month, worse since this morning. Pt states that his filling fell out 1 month ago. He put a temporary filling in until he is able to follow-up with a dentist. Pt reports constant, throbbing pain that is exacerbated with chewing and palpation. He reports associated swelling onset this morning. He has tried ibuprofen with relief at times. He has tried a dental block in the past with temporary relief. Pt denies fever. No known medical allergies.  VSS.  Past Medical History  Diagnosis Date  . Hypertension   . Chronic back pain   . Migraine    Past Surgical History  Procedure Laterality Date  . Wrist surgery  right   No family history on file. History  Substance Use Topics  . Smoking status: Former Smoker -- 0.50 packs/day    Types: Cigarettes  . Smokeless tobacco: Not on file  . Alcohol Use: No     Comment: 1-2 beers every weekend    Review of Systems  Constitutional: Negative for fever.  HENT: Positive for dental problem.   All other systems reviewed and are negative.  Allergies  Hydrocodone and Morphine and related  Home Medications   Prior to Admission medications   Medication Sig Start Date End Date Taking? Authorizing Provider  acetaminophen-codeine (TYLENOL #3) 300-30 MG per tablet Take 1-2 tablets by mouth every 6 (six) hours as needed. Patient not taking: Reported on  01/24/2015 12/06/14   Ivery Quale, PA-C  amoxicillin (AMOXIL) 500 MG capsule Take 1 capsule (500 mg total) by mouth 3 (three) times daily. Patient not taking: Reported on 01/24/2015 12/06/14   Ivery Quale, PA-C  benazepril-hydrochlorthiazide (LOTENSIN HCT) 20-25 MG per tablet Take 1 tablet by mouth daily.    Historical Provider, MD  diphenhydrAMINE (BENADRYL) 25 MG tablet Take 1 tablet (25 mg total) by mouth every 6 (six) hours. Use if needed for itching. Patient not taking: Reported on 01/24/2015 12/06/14   Ivery Quale, PA-C  hydrochlorothiazide (HYDRODIURIL) 12.5 MG tablet Take 2 tablets (25 mg total) by mouth daily. Patient not taking: Reported on 12/06/2014 03/30/14   Burgess Amor, PA-C  ibuprofen (ADVIL,MOTRIN) 800 MG tablet Take 1 tablet (800 mg total) by mouth 3 (three) times daily. 01/24/15   Marily Memos, MD  oxyCODONE-acetaminophen (PERCOCET/ROXICET) 5-325 MG per tablet Take 1 tablet by mouth every 4 (four) hours as needed. Patient not taking: Reported on 12/06/2014 03/30/14   Burgess Amor, PA-C   BP 149/112 mmHg  Pulse 80  Temp(Src) 98.1 F (36.7 C) (Oral)  Resp 18  Ht  (1.905 m)  Wt 275 lb (124.739 kg)  BMI 34.37 kg/m2  SpO2 94% Physical Exam  Constitutional: He is oriented to person, place, and time. He appears well-developed and well-nourished. No distress.  HENT:  Head: Normocephalic and atraumatic.  Mouth/Throat: Oropharynx is clear and moist.  Teeth largely in fair dentition,  temporary filling noted of left upper pre-molar, surrounding gingiva normal in appearance, slight swelling of left upper cheek without extension into neck, handling secretions appropriately, no trismus  Eyes: Conjunctivae and EOM are normal. Pupils are equal, round, and reactive to light.  Neck: Normal range of motion. Neck supple.  Cardiovascular: Normal rate, regular rhythm and normal heart sounds.   Pulmonary/Chest: Effort normal and breath sounds normal. No respiratory distress. He has no wheezes.   Musculoskeletal: Normal range of motion. He exhibits no edema.  Neurological: He is alert and oriented to person, place, and time.  Skin: Skin is warm and dry. He is not diaphoretic.  Psychiatric: He has a normal mood and affect.  Nursing note and vitals reviewed.  ED Course  Procedures (including critical care time) DIAGNOSTIC STUDIES: Oxygen Saturation is 94% on RA, adequate by my interpretation.    COORDINATION OF CARE: 10:56 AM-Discussed treatment plan which includes Veetid, Naproxen, Xylocaine and follow-up with dentist with pt at bedside and pt agreed to plan.   Labs Review Labs Reviewed - No data to display  Imaging Review No results found.   EKG Interpretation None      MDM   Final diagnoses:  Pain, dental   34 y.o. M here with left upper dental pain. He was seen for the same a few weeks ago and referred to dentist, however has not followed up. He does have a temporary filling in place, mild swelling of left cheek noted without extension into neck. He is handling secretions well, no trismus. Patient is afebrile, nontoxic. He may have developing infection given swelling, however there is no visible abscess for drainage on exam.  Will start on antibiotics. Patient given Naprosyn and viscous lidocaine for pain control. He was again encouraged to follow with dentist.  Discussed plan with patient, he/she acknowledged understanding and agreed with plan of care.  Return precautions given for new or worsening symptoms.  I personally performed the services described in this documentation, which was scribed in my presence. The recorded information has been reviewed and is accurate.  Garlon Hatchet, PA-C 02/10/15 1231  Gwyneth Sprout, MD 02/16/15 2150

## 2015-02-10 NOTE — Discharge Instructions (Signed)
Take the prescribed medication as directed. Follow-up with dentist-- call to make appt. Return to the ED for new or worsening symptoms.   Emergency Department Resource Guide 1) Find a Doctor and Pay Out of Pocket Although you won't have to find out who is covered by your insurance plan, it is a good idea to ask around and get recommendations. You will then need to call the office and see if the doctor you have chosen will accept you as a new patient and what types of options they offer for patients who are self-pay. Some doctors offer discounts or will set up payment plans for their patients who do not have insurance, but you will need to ask so you aren't surprised when you get to your appointment.  2) Contact Your Local Health Department Not all health departments have doctors that can see patients for sick visits, but many do, so it is worth a call to see if yours does. If you don't know where your local health department is, you can check in your phone book. The CDC also has a tool to help you locate your state's health department, and many state websites also have listings of all of their local health departments.  3) Find a Walk-in Clinic If your illness is not likely to be very severe or complicated, you may want to try a walk in clinic. These are popping up all over the country in pharmacies, drugstores, and shopping centers. They're usually staffed by nurse practitioners or physician assistants that have been trained to treat common illnesses and complaints. They're usually fairly quick and inexpensive. However, if you have serious medical issues or chronic medical problems, these are probably not your best option.  No Primary Care Doctor: - Call Health Connect at  [phone] - they can help you locate a primary care doctor that  accepts your insurance, provides certain services, etc. - Physician Referral Service- [phone]  Chronic Pain Problems: Organization         Address  Phone    Notes  [facility]  [phone] Patients need to be referred by their primary care doctor.   Medication Assistance: Organization         Address  Phone   Notes  [facility] Medication [facility] [address] [place] [place], [place] [address] [phone] --Must be a resident of [facility] -- Must have NO insurance coverage whatsoever (no Medicaid/ Medicare, etc.) -- The pt. MUST have a primary care doctor that directs their care regularly and follows them in the community   [facility]ist  [phone]   [employer]  [phone]    Agencies that provide inexpensive medical care: Organization         Address  Phone   Notes  [facility]  [phone]   [facility]    [phone]   [facility] [address] [place], [place] [address] [phone]   [facility] [address] [place], [place] [phone]   Planned Parenthood    [phone]   [facility]    [phone]   Community Health and [facility]  [address], [place] Phone:  [phone], Fax:  [phone] Hours of Operation:  9 am - 6 pm, M-F.  Also accepts Medicaid/Medicare and self-pay.  [facility]  [address], [place] Phone: [phone],  Fax: [phone]. Hours of Operation:  8:30 am - 5:30 pm, M-F.  Also accepts Medicaid and self-pay.  [facility] [address], [place] Phone: [phone]   [place], [place], [place] [phone], Ext. [phone] Mondays & Thursdays: 7-9 AM.  First 15 patients are seen on a first come, first serve basis.    [place] Providers:  Organization         Address  Phone   Notes  [facility] [address],  [phone] Also accepts self-pay patients.  [facility]  [address], [place]  [phone]   [place], [address], [place] [phone]   [facility] [address], [place] [phone]   Lucianne Lei [address], [place]   [phone] Only accepts [place] Access [place] patients after they have their name applied to their card.   Self-Pay (no insurance) in [facility]:  Organization         Address  Phone   Notes  Sickle Cell Patients, [facility] [place] [phone]   [facility] [place] [phone]   [facility] [place]  [place], [place], [place] [phone]   [facility]/Dr. Osei-Bonsu  [address], [place] or [address], [place] [phone] Phone number for both [place] and [place] locations is the same.  Urgent Medical and [facility] [address], [place] [phone]   [facility] [address] [place] or [address] [phone]   [facility] [address] [place] [phone], phone; [phone], fax Sees patients 1st and 3rd Saturday of every month.  Must not qualify for public or private insurance (i.e. Medicaid, Medicare, [place] Health Choice, Veterans' Benefits)  Household income should be no more than 200% of the poverty level The clinic cannot treat you if you are pregnant or think you are pregnant  Sexually transmitted diseases are not treated at the clinic.    Dental Care: Organization         Address  Phone  Notes  [facility] Department of [facility] [place] [phone] Accepts children up to age 22 who are enrolled in [place] or [place]; pregnant women with a Medicaid card; and children who have  applied for Medicaid or [place] Health Choice, but were declined, whose parents can pay a reduced fee at time of service.  [facility] Department of [facility]  [address], [place] [phone] Accepts children up to age 33 who are enrolled in [place] or [place]; pregnant women with a Medicaid card; and children who have applied for Medicaid or [place] Health Choice, but were declined, whose parents can pay a reduced fee at time of service.  [place] Adult [facility] Access PROGRAM  [place] [phone] Patients are seen by appointment only. Walk-ins are not accepted. [place] will see patients 13 years of age and older. Monday - Tuesday (8am-5pm) Most Wednesdays (8:30-5pm) $30 per visit, cash only  [place] [facility] [employer] [facility]  [address], [employer] (  [phone] Patients are seen by appointment only. Walk-ins are not accepted. [place] will see patients 46 years of age and older. One Wednesday Evening (Monthly: Volunteer Based).  $30 per visit, cash only  [place]  [phone] for adults; Children under age 38, call Graduate Pediatric Dentistry at [phone]. Children aged 28-14, please call [phone] to request a pediatric application.  Dental services are provided in all areas of dental care including fillings, crowns and bridges, complete and partial dentures, implants, gum treatment, root canals, and extractions. Preventive care is also provided. Treatment is provided to both adults and children. Patients are selected via a lottery and there is often a waiting list.   [facility] [address] [place]  [phone] [email or web address]   [facility] [address] [place], [place] [phone], Ext. [phone] Second and Fourth Thursday of each month, opens at 6:30 AM; Clinic ends at 9 AM.  Patients are seen on a first-come first-served basis, and a  limited number are seen during each clinic.   [facility]  [address] Hillard Danker [place], [place] [phone]   Eligibility Requirements You must have lived in [place], [place], or [place] counties for at least the last three months.   You cannot be eligible for state or federal sponsored [employer], including [employer], [place], or [employer].   You generally cannot be eligible for healthcare insurance through your employer.    How to apply: Eligibility screenings are held every Tuesday and Wednesday afternoon from 1:00 pm until 4:00 pm. You do not need an appointment for the interview!  [facility] [address], [place], [place]   [place]  [place] Department  [place]  [phone]    Behavioral Health Resources in the Community: Intensive Outpatient Programs Organization         Address  Phone  Notes  [place]. [address], [place], [place] [phone]   [facility]patient [address] [place], [place]   ADS: [facility] [address] [place], [place]   [place] [address],  [place], [place] or [phone]   Substance Abuse Resources Organization         Address  Phone  Notes  Al[facility] and Drug Services  [phone]   [place]  [phone]   [facility] [place]   Chinita Pester  [phone]   Residential & Outpatient Substance Abuse Program  [phone]   Psychological Services Organization         Address  Phone  Notes  [facility]  [place]  [phone]   [place] [address] [place] or [phone]    Mobile Crisis Teams Organization          Address  Phone  Notes  [facility], Mobile Crisis Care Unit  [phone]   [facility]  [address] [place], [place]   [place] [address], [place] [phone]    Self-Help/Support Groups Organization         Address  Phone             Notes  [place]. of [place] - variety of support groups  336- H3156881 Call for more information  Narcotics Anonymous (NA), [facility] [address], [employer] [place]  2 meetings at this location   Residential Facilities manager         Address  Phone  Notes  [facility] [place],    [place]  [phone]   [facility]  [address], [place] [id number], [place], [place]   [place], [place] [phone] Admissions: 8am-3pm M-F  Incentives Substance [place] [address]    [employer], [place] [phone]   [facility] [address] [place], [place], [place]   [facility] [address],  [place], [place]   [facility] - Intensive Outpatient [address] Kristeen [address], [place], [place]   [facility] ([place].,  [place], [place] [phone] or [phone]   [facility] ([facility]) [address], [place], [place] Accepts Medicaid  [facility] [address]  [place] [phone] Substance Abuse/Addiction Treatment   [facility] Organization         Address  Phone  Notes  [facility]  [phone]   Domenic Schwab, PhD [address] Arlis Porta [place], [place]   [phone] or [phone]   [place], [place] [phone]   [facility] [address], [place], [place] [phone] Insurance/Medicaid/sponsorship  through [facility] and Families [address]                                    [place], [place] [phone] [place] [address], [place] [phone]    Dr. Adele Schilder  [phone]   [facility] Dept. 1) [address] [place] 2) [place] 3)  [place] [address], [place] [phone]  [phone]   [place] [phone] or [phone] (After Hours)

## 2015-02-10 NOTE — ED Notes (Signed)
Sheppard Penton RN informed of high BP.

## 2015-05-29 ENCOUNTER — Emergency Department (HOSPITAL_COMMUNITY)
Admission: EM | Admit: 2015-05-29 | Discharge: 2015-05-29 | Disposition: A | Discharge status: Home / Self Care | Payer: Self-pay | Attending: Emergency Medicine | Admitting: Emergency Medicine

## 2015-05-29 ENCOUNTER — Encounter (HOSPITAL_COMMUNITY): Payer: Self-pay

## 2015-05-29 DIAGNOSIS — G43909 Migraine, unspecified, not intractable, without status migrainosus: Secondary | ICD-10-CM | POA: Insufficient documentation

## 2015-05-29 DIAGNOSIS — I10 Essential (primary) hypertension: Secondary | ICD-10-CM | POA: Insufficient documentation

## 2015-05-29 DIAGNOSIS — G8929 Other chronic pain: Secondary | ICD-10-CM | POA: Insufficient documentation

## 2015-05-29 DIAGNOSIS — Z792 Long term (current) use of antibiotics: Secondary | ICD-10-CM | POA: Insufficient documentation

## 2015-05-29 DIAGNOSIS — Z79899 Other long term (current) drug therapy: Secondary | ICD-10-CM | POA: Insufficient documentation

## 2015-05-29 DIAGNOSIS — Z791 Long term (current) use of non-steroidal anti-inflammatories (NSAID): Secondary | ICD-10-CM | POA: Insufficient documentation

## 2015-05-29 DIAGNOSIS — G43009 Migraine without aura, not intractable, without status migrainosus: Secondary | ICD-10-CM

## 2015-05-29 DIAGNOSIS — Z87891 Personal history of nicotine dependence: Secondary | ICD-10-CM | POA: Insufficient documentation

## 2015-05-29 MED ORDER — SUMATRIPTAN SUCCINATE 6 MG/0.5ML ~~LOC~~ SOLN
6.0000 mg | Freq: Once | SUBCUTANEOUS | Status: AC
  Administered 2015-05-29: 6 mg via SUBCUTANEOUS
  Filled 2015-05-29: qty 0.5

## 2015-05-29 MED ORDER — DEXAMETHASONE SODIUM PHOSPHATE 4 MG/ML IJ SOLN
10.0000 mg | Freq: Once | INTRAMUSCULAR | Status: AC
Start: 2015-05-29 — End: 2015-05-29
  Administered 2015-05-29: 10 mg via INTRAVENOUS
  Filled 2015-05-29: qty 3

## 2015-05-29 MED ORDER — METOCLOPRAMIDE HCL 5 MG/ML IJ SOLN
10.0000 mg | Freq: Once | INTRAMUSCULAR | Status: AC
  Administered 2015-05-29: 10 mg via INTRAVENOUS
  Filled 2015-05-29: qty 2

## 2015-05-29 MED ORDER — BUTALBITAL-APAP-CAFFEINE 50-325-40 MG PO TABS: 1.0000 | ORAL_TABLET | Freq: Four times a day (QID) | ORAL | Status: DC | PRN

## 2015-05-29 MED ORDER — PROMETHAZINE HCL 25 MG PO TABS: 25.0000 mg | ORAL_TABLET | Freq: Four times a day (QID) | ORAL | Status: DC | PRN

## 2015-05-29 MED ORDER — KETOROLAC TROMETHAMINE 30 MG/ML IJ SOLN
30.0000 mg | Freq: Once | INTRAMUSCULAR | Status: AC
  Administered 2015-05-29: 30 mg via INTRAVENOUS
  Filled 2015-05-29: qty 1

## 2015-05-29 MED ORDER — HYDROCHLOROTHIAZIDE 25 MG PO TABS: 25.0000 mg | ORAL_TABLET | Freq: Every day | ORAL | Status: DC

## 2015-05-29 MED ORDER — PROCHLORPERAZINE EDISYLATE 5 MG/ML IJ SOLN
10.0000 mg | Freq: Once | INTRAMUSCULAR | Status: AC
  Administered 2015-05-29: 10 mg via INTRAVENOUS
  Filled 2015-05-29: qty 2

## 2015-05-29 MED ORDER — DIPHENHYDRAMINE HCL 50 MG/ML IJ SOLN
25.0000 mg | Freq: Once | INTRAMUSCULAR | Status: AC
  Administered 2015-05-29: 25 mg via INTRAVENOUS
  Filled 2015-05-29: qty 1

## 2015-05-29 MED ORDER — SODIUM CHLORIDE 0.9 % IV BOLUS (SEPSIS)
1000.0000 mL | Freq: Once | INTRAVENOUS | Status: AC
  Administered 2015-05-29: 1000 mL via INTRAVENOUS

## 2015-05-29 NOTE — Discharge Instructions (Signed)
Migraine Headache °A migraine headache is an intense, throbbing pain on one or both sides of your head. A migraine can last for 30 minutes to several hours. °CAUSES  °The exact cause of a migraine headache is not always known. However, a migraine may be caused when nerves in the brain become irritated and release chemicals that cause inflammation. This causes pain. °Certain things may also trigger migraines, such as: °· Alcohol. °· Smoking. °· Stress. °· Menstruation. °· Aged cheeses. °· Foods or drinks that contain nitrates, glutamate, aspartame, or tyramine. °· Lack of sleep. °· Chocolate. °· Caffeine. °· Hunger. °· Physical exertion. °· Fatigue. °· Medicines used to treat chest pain (nitroglycerine), birth control pills, estrogen, and some blood pressure medicines. °SIGNS AND SYMPTOMS °· Pain on one or both sides of your head. °· Pulsating or throbbing pain. °· Severe pain that prevents daily activities. °· Pain that is aggravated by any physical activity. °· Nausea, vomiting, or both. °· Dizziness. °· Pain with exposure to bright lights, loud noises, or activity. °· General sensitivity to bright lights, loud noises, or smells. °Before you get a migraine, you may get warning signs that a migraine is coming (aura). An aura may include: °· Seeing flashing lights. °· Seeing bright spots, halos, or zigzag lines. °· Having tunnel vision or blurred vision. °· Having feelings of numbness or tingling. °· Having trouble talking. °· Having muscle weakness. °DIAGNOSIS  °A migraine headache is often diagnosed based on: °· Symptoms. °· Physical exam. °· A CT scan or MRI of your head. These imaging tests cannot diagnose migraines, but they can help rule out other causes of headaches. °TREATMENT °Medicines may be given for pain and nausea. Medicines can also be given to help prevent recurrent migraines.  °HOME CARE INSTRUCTIONS °· Only take over-the-counter or prescription medicines for pain or discomfort as directed by your  health care provider. The use of long-term narcotics is not recommended. °· Lie down in a dark, quiet room when you have a migraine. °· Keep a journal to find out what may trigger your migraine headaches. For example, write down: °¨ What you eat and drink. °¨ How much sleep you get. °¨ Any change to your diet or medicines. °· Limit alcohol consumption. °· Quit smoking if you smoke. °· Get 7-9 hours of sleep, or as recommended by your health care provider. °· Limit stress. °· Keep lights dim if bright lights bother you and make your migraines worse. °SEEK IMMEDIATE MEDICAL CARE IF:  °· Your migraine becomes severe. °· You have a fever. °· You have a stiff neck. °· You have vision loss. °· You have muscular weakness or loss of muscle control. °· You start losing your balance or have trouble walking. °· You feel faint or pass out. °· You have severe symptoms that are different from your first symptoms. °MAKE SURE YOU:  °· Understand these instructions. °· Will watch your condition. °· Will get help right away if you are not doing well or get worse. °  °This information is not intended to replace advice given to you by your health care provider. Make sure you discuss any questions you have with your health care provider. °  °Document Released: 06/20/2005 Document Revised: 07/11/2014 Document Reviewed: 02/25/2013 °Elsevier Interactive Patient Education ©2016 Elsevier Inc. ° ° °Emergency Department Resource Guide °1) Find a Doctor and Pay Out of Pocket °Although you won't have to find out who is covered by your insurance plan, it is a good idea to   ask around and get recommendations. You will then need to call the office and see if the doctor you have chosen will accept you as a new patient and what types of options they offer for patients who are self-pay. Some doctors offer discounts or will set up payment plans for their patients who do not have insurance, but you will need to ask so you aren't surprised when you get to  your appointment. ° °2) Contact Your Local Health Department °Not all health departments have doctors that can see patients for sick visits, but many do, so it is worth a call to see if yours does. If you don't know where your local health department is, you can check in your phone book. The CDC also has a tool to help you locate your state's health department, and many state websites also have listings of all of their local health departments. ° °3) Find a Walk-in Clinic °If your illness is not likely to be very severe or complicated, you may want to try a walk in clinic. These are popping up all over the country in pharmacies, drugstores, and shopping centers. They're usually staffed by nurse practitioners or physician assistants that have been trained to treat common illnesses and complaints. They're usually fairly quick and inexpensive. However, if you have serious medical issues or chronic medical problems, these are probably not your best option. ° °No Primary Care Doctor: °- Call [facility] at  832-8000 - they can help you locate a primary care doctor that  accepts your insurance, provides certain services, etc. °- Physician Referral Service- 1-800-533-3463 ° °Chronic Pain Problems: °Organization         Address  Phone   Notes  °[facility]  (336) 297-2271 Patients need to be referred by their primary care doctor.  ° °Medication Assistance: °Organization         Address  Phone   Notes  °[place] [facility] [address] °[place], [place] [address] (336) 641-8030 --Must be a resident of [place] °-- Must have NO insurance coverage whatsoever (no Medicaid/ Medicare, etc.) °-- The pt. MUST have a primary care doctor that directs their care regularly and follows them in the community °  °[facility]  (866) 331-1348   °[employer]  (888) 892-1162   ° °Agencies that provide inexpensive medical care: °Organization         Address  Phone    Notes  °[facility]  (336) 832-8035   °[facility]    (336) 832-7272   °[facility] [address] °[place], [place] [address] (336) 832-4777   °[name] [address], °[place] (336) 271-4999   °[facility]    (336) 373-0678   °[facility]    (336) 272-1050   °[facility] ° [address], [place] Phone:  (336) 832-4444, Fax:  (336) 832-4440 Hours of Operation:  9 am - 6 pm, M-F.  Also accepts Medicaid/Medicare and self-pay.  °[facility] ° [address], [place] Phone: (336) 832-3150, Fax: (336) [phone]. Hours of Operation:  8:30 am - 5:30 pm, M-F.  Also accepts Medicaid and self-pay.  °[facility] [place] [address], [place] Phone: (336) [phone]   °[facility] [address], [place], [place] (336)723-1848, Ext. 123 Mondays & Thursdays: 7-9 AM.  First 15 patients are   are seen on a first come, first serve basis.    Medicaid-accepting [facility] Providers:  Organization         Address  Phone   Notes  [facility] [address], [place] [phone] Also accepts self-pay patients.  [facility] [address] [address], [place]  [phone]   [facility] [address], [place] [phone]   [facility] [address] [place] [address], [place] [phone]) [phone]   Renaye RakersVeita Bland [address], [place]   [phone]) [phone] Only accepts [place] Access [place] patients after they have their name applied to their card.   Self-Pay (no insurance) in [facility]:  Organization         Address  Phone   Notes  Sickle Cell Patients, [facility] [address], [place] [phone]   [facility] [address], [place] [phone]   [facility] [place]  [address] [place] [address], [place] [phone]   [facility]/Dr. Osei-Bonsu  [address] [place] [address], [place] or [phone], [place] [phone] Phone number for both [place] and [place] locations is the same.  [facility] [address], [place] [phone]   [facility] [address] [place] [address], [place] or [address] [phone]   [facility] [address], [place] [phone], phone; [phone](336) [phone], fax Sees patients 1st and 3rd Saturday of every month.  Must not qualify for public or private insurance (i.e. Medicaid, Medicare, [place] Health Choice, Veterans' Benefits)  Household income should be no more than 200% of the poverty level The clinic cannot treat you if you are pregnant or think you are pregnant  Sexually transmitted diseases are not treated at the clinic.    Dental Care: Organization         Address  Phone  Notes  [facility] [address], [place] [phone] Accepts children up to age 35 who are enrolled in [place] or [place] Health Choice; pregnant women with a Medicaid card; and children who have applied for Medicaid or [place] Health Choice, but were declined, whose parents can pay a reduced fee at time of service.  [facility] [place]  [address], [place] [phone]) [phone] Accepts children up to age 35 who are enrolled in [place] or [place] Health Choice; pregnant women with a Medicaid card; and children who have applied for Medicaid or [place] Health Choice, but were declined, whose parents can pay a reduced fee at time of service.  [facility]  [address], [place] [phone] Patients are seen by appointment only. Walk-ins are not accepted. [facility] will see patients 35 years of age  and older. Monday - Tuesday (8am-5pm) Most Wednesdays (8:30-5pm) $30 per visit, cash only  [facility]  [address], [place] [facility] [phone] Patients are seen by appointment only. Walk-ins are not accepted. [facility] will see patients 35 years of age and older. One Wednesday Evening (Monthly: Volunteer Based).  $30 per visit, cash only  [facility]  [phone] for adults; Children under age 254, call Graduate Pediatric Dentistry at [phone]. Children aged 4-14, please call [phone] to request a pediatric application. ° Dental services are provided in all areas of dental care including fillings, crowns and bridges, complete and partial dentures, implants, gum treatment, root canals, and extractions. Preventive care is also provided. Treatment is provided to both adults and children. °Patients are selected via a lottery and there is often a waiting list. °  °[facility] [address], °[place] ° (336) 763-8833 [email or web address] °  °[facility] [address], [place], [place] (336)723-1848, Ext. 123 Second and Fourth Thursday of each month, opens at 6:30 AM; Clinic ends at 9 AM.  Patients are seen on a first-come first-served basis, and a limited number are seen during each clinic.  ° °[facility] ° [address], [place], [place] (336) [phone]   Eligibility Requirements °You must have lived in [place], [place], or [place] counties for at least the last three months. °  You cannot be eligible for state or federal sponsored healthcare insurance, including Veterans Administration, Medicaid, or Medicare. °  You generally cannot be eligible for healthcare insurance through your employer.  °  How to apply: °Eligibility screenings are held every Tuesday and Wednesday afternoon from 1:00 pm until 4:00 pm. You do not need an appointment for the interview!  °[facility] [address], [place], [place] [phone]   °[place] [facility]  336-342-8273   °[place] [facility]  336-703-3100   °[facility]  336-570-6415   ° °Behavioral Health Resources in the Community: °Intensive Outpatient Programs °Organization         Address  Phone  Notes  °[facility] [address], [place], [place] 336-878-6098   °[facility] [address], [place], [place] 336-832-9800   °[facility] [address], [place], [place] ° 336-882-2125   °[facility] [address],  °[place], [place] 1-800-853-5163 or 336-641-4981   °Substance Abuse Resources °Organization         Address  Phone  Notes  °[facility]  336-882-2125   °[facility]  336-784-9470   °[facility]  336-285-9073   °[facility]  336-845-3988   °Residential & Outpatient Substance Abuse Program  1-800-659-3381   °Psychological Services °Organization         Address  Phone  Notes  °[facility]  336- 832-9600   °[facility]  336- 378-7881   °[place] [address], [place] 1-800-853-5163 or 336-641-4981   ° °Mobile Crisis Teams °Organization         Address  Phone  Notes  °[facility], Mobile Crisis Care Unit  1-877-626-1772   °Assertive °[facility] ° [address] [place], [place] 336-834-9664   °Sharon DeEsch [address] °[place] 336-554-5454   ° °Self-Help/Support Groups °Organization         Address  Phone             Notes  °[facility] [place] - variety of support groups  336- 373-1402 Call for more information  °Narcotics Anonymous (NA), [facility] [address], °[place]  2 meetings at this location  ° °Residential Treatment Programs °Organization         Address  Phone  Notes  °[facility] [address],    °   1-866-801-8205   °  [facility] ° [address], [place], [place] 704-293-8524   °[facility] [address], [place] 336-845-3988 Admissions: 8am-3pm M-F  °[facility] [address]    °[place], [place] 336-841-1104   °[facility] [address], [place], [place] 336-379-7146   °[facility] [address]  °[place], [place] 336-285-9073   °[facility] Intensive Outpatient [address], [place], [place] 336-852-3033   °[facility] [address]  °[place], [place] 1-877-615-2722 or 336-784-9470   °[facility] ([facility] [address] [place], [place] 336-227-7417 Accepts Medicaid  °[facility] [address]  °[place] 1-800-659-3381 Substance Abuse/Addiction Treatment  ° °[place] Behavioral Health Resources °Organization         Address  Phone  Notes  °[facility]  (888) 581-9988   °Julie Brannon, PhD [address] [place], [place]   (336) 349-5553 or (336) 951-0000   °[facility]   [address] °[place], [place] (336) 349-4454   °[facility] [address], [place], [place] (336) 342-8316 Insurance/Medicaid/sponsorship through [facility]  °[facility] [address]                                    [place], [place] (336) 342-8316 Therapy/tele-psych/case  °[facility] [address]  ° [place], [place] (336) 349-2233    °Dr. Arfeen  (336) 349-4544   °[facility] [place]  [employer] [place] [facility] 1) [address], [place] °2) [address], [place] °3)  [address], [place] (336) 349-3220 °(336) 342-7768 ° °(336) 342-8140   °[place] Child Abuse Hotline (336) 342-1394 or (336) 342-3537 (After Hours)    ° ° ° °

## 2015-05-29 NOTE — ED Notes (Signed)
Pt resting quietly.  Respirations regular even and unlabored

## 2015-05-29 NOTE — ED Provider Notes (Signed)
TIME SEEN: 2:45 AM   CHIEF COMPLAINT: Migraine headache  HPI: Pt is a 34 y.o. male with history of hypertension, migraines who presents to the emergency department with a migraine headache. Reports headache has been present for 3 days and is severe, diffuse and throbbing. Gradual in onset. Feels similar to his prior migraines. Worse with lights and sounds. Took ibuprofen this morning without much relief. States normally at home over-the-counter NSAIDs and eating in a quiet room will help his headaches. He does intermittently have succumbed emergency department for treatment and migraine cocktails have helped in the past. No head injury. Not on anticoagulation or antiplatelet agents. No numbness, tingling or focal weakness. No fever, neck pain or neck stiffness. Denies sudden onset, thunderclap headache.  ROS: See HPI Constitutional: no fever  Eyes: no drainage  ENT: no runny nose   Cardiovascular:  no chest pain  Resp: no SOB  GI: no vomiting GU: no dysuria Integumentary: no rash  Allergy: no hives  Musculoskeletal: no leg swelling  Neurological: no slurred speech ROS otherwise negative  PAST MEDICAL HISTORY/PAST SURGICAL HISTORY:  Past Medical History  Diagnosis Date  . Hypertension   . Chronic back pain   . Migraine     MEDICATIONS:  Prior to Admission medications   Medication Sig Start Date End Date Taking? Authorizing Provider  hydrochlorothiazide (HYDRODIURIL) 12.5 MG tablet Take 2 tablets (25 mg total) by mouth daily. 03/30/14  Yes Burgess Amor, PA-C  ibuprofen (ADVIL,MOTRIN) 800 MG tablet Take 1 tablet (800 mg total) by mouth 3 (three) times daily. 01/24/15  Yes Marily Memos, MD  acetaminophen-codeine (TYLENOL #3) 300-30 MG per tablet Take 1-2 tablets by mouth every 6 (six) hours as needed. Patient not taking: Reported on 01/24/2015 12/06/14   Ivery Quale, PA-C  amoxicillin (AMOXIL) 500 MG capsule Take 1 capsule (500 mg total) by mouth 3 (three) times daily. Patient not taking:  Reported on 01/24/2015 12/06/14   Ivery Quale, PA-C  benazepril-hydrochlorthiazide (LOTENSIN HCT) 20-25 MG per tablet Take 1 tablet by mouth daily.    Historical Provider, MD  diphenhydrAMINE (BENADRYL) 25 MG tablet Take 1 tablet (25 mg total) by mouth every 6 (six) hours. Use if needed for itching. Patient not taking: Reported on 01/24/2015 12/06/14   Ivery Quale, PA-C  lidocaine (XYLOCAINE) 2 % solution Apply topically as needed for pain. 02/10/15   Garlon Hatchet, PA-C  naproxen (NAPROSYN) 500 MG tablet Take 1 tablet (500 mg total) by mouth 2 (two) times daily with a meal. 02/10/15   Garlon Hatchet, PA-C  oxyCODONE-acetaminophen (PERCOCET/ROXICET) 5-325 MG per tablet Take 1 tablet by mouth every 4 (four) hours as needed. Patient not taking: Reported on 12/06/2014 03/30/14   Burgess Amor, PA-C  penicillin v potassium (VEETID) 500 MG tablet Take 1 tablet (500 mg total) by mouth 4 (four) times daily. 02/10/15   Garlon Hatchet, PA-C    ALLERGIES:  Allergies  Allergen Reactions  . Hydrocodone Itching  . Morphine And Related Itching    SOCIAL HISTORY:  Social History  Substance Use Topics  . Smoking status: Former Smoker -- 0.50 packs/day    Types: Cigarettes  . Smokeless tobacco: Not on file  . Alcohol Use: No     Comment: 1-2 beers every weekend    FAMILY HISTORY: No family history on file.  EXAM: BP 162/98 mmHg  Pulse 75  Temp(Src) 98.1 F (36.7 C) (Oral)  Resp 18  Ht  (1.905 m)  Wt 275 lb (124.739  kg)  BMI 34.37 kg/m2  SpO2 100% CONSTITUTIONAL: Alert and oriented and responds appropriately to questions. Well-appearing; well-nourished, appears uncomfortable but afebrile and nontoxic HEAD: Normocephalic EYES: Conjunctivae clear, PERRL, patient has photophobia ENT: normal nose; no rhinorrhea; moist mucous membranes; pharynx without lesions noted NECK: Supple, no meningismus, no LAD  CARD: RRR; S1 and S2 appreciated; no murmurs, no clicks, no rubs, no gallops RESP: Normal chest  excursion without splinting or tachypnea; breath sounds clear and equal bilaterally; no wheezes, no rhonchi, no rales, no hypoxia or respiratory distress, speaking full sentences ABD/GI: Normal bowel sounds; non-distended; soft, non-tender, no rebound, no guarding, no peritoneal signs BACK:  The back appears normal and is non-tender to palpation, there is no CVA tenderness EXT: Normal ROM in all joints; non-tender to palpation; no edema; normal capillary refill; no cyanosis, no calf tenderness or swelling    SKIN: Normal color for age and race; warm NEURO: Moves all extremities equally, sensation to light touch intact diffusely, cranial nerves II through XII intact PSYCH: The patient's mood and manner are appropriate. Grooming and personal hygiene are appropriate.  MEDICAL DECISION MAKING: Patient here with typical migraine headache. Neurologically intact. No nuchal rigidity or meningismus. Afebrile. No red flag symptoms to suggest subarachnoid hemorrhage. I do not feel he needs emergent head imaging. Will give IV Toradol, Reglan, Benadryl, Decadron and IV fluids and reassess.  ED PROGRESS: 4:05 AM  Patient reports his headache is now a 7/10 down to 10/10 after above medications. Reports the last and he was here "they gave me something that started with a D". It appears patient received IV Dilaudid during his last visit. Discussed with patient the literature strongly recommend against giving her contacts for migraine control. Will give dose of IV Compazine and reassess.   5:05 AM  Pt's HA is now 4/10.  He would like something else for pain.  Will try Imitrex.  No history of heart disease. He is also requesting that I refill his blood pressure medication. He is on hydrochlorothiazide 25 mg once a day.  States he does not have a PCP at this time.   6:20 AM  Pt reports no significant improvement in his headache after Imitrex. I have offered him other medications which at this time he refuses stating he  would like to go home and go to bed. We'll discharge patient with prescription for Fioricet and Phenergan but also discharge him with neurology follow-up information. Discussed return precautions. He verbalizes understanding and is comfortable with this plan.  Layla MawKristen N Ward, DO 05/29/15 [phone]

## 2015-12-09 ENCOUNTER — Encounter (HOSPITAL_COMMUNITY): Payer: Self-pay | Admitting: Emergency Medicine

## 2015-12-09 ENCOUNTER — Emergency Department (HOSPITAL_COMMUNITY)
Admission: EM | Admit: 2015-12-09 | Discharge: 2015-12-09 | Disposition: A | Discharge status: Home / Self Care | Payer: Self-pay | Attending: Emergency Medicine | Admitting: Emergency Medicine

## 2015-12-09 DIAGNOSIS — Z87891 Personal history of nicotine dependence: Secondary | ICD-10-CM | POA: Insufficient documentation

## 2015-12-09 DIAGNOSIS — R51 Headache: Secondary | ICD-10-CM | POA: Insufficient documentation

## 2015-12-09 DIAGNOSIS — K029 Dental caries, unspecified: Secondary | ICD-10-CM | POA: Insufficient documentation

## 2015-12-09 DIAGNOSIS — M5432 Sciatica, left side: Secondary | ICD-10-CM

## 2015-12-09 DIAGNOSIS — I10 Essential (primary) hypertension: Secondary | ICD-10-CM | POA: Insufficient documentation

## 2015-12-09 DIAGNOSIS — M5442 Lumbago with sciatica, left side: Secondary | ICD-10-CM | POA: Insufficient documentation

## 2015-12-09 MED ORDER — DEXAMETHASONE SODIUM PHOSPHATE 4 MG/ML IJ SOLN
8.0000 mg | Freq: Once | INTRAMUSCULAR | Status: AC
  Administered 2015-12-09: 8 mg via INTRAMUSCULAR
  Filled 2015-12-09: qty 2

## 2015-12-09 MED ORDER — DICLOFENAC SODIUM 75 MG PO TBEC: 75.0000 mg | DELAYED_RELEASE_TABLET | Freq: Two times a day (BID) | ORAL | Status: DC

## 2015-12-09 MED ORDER — ONDANSETRON HCL 4 MG PO TABS
4.0000 mg | ORAL_TABLET | Freq: Once | ORAL | Status: AC
  Administered 2015-12-09: 4 mg via ORAL
  Filled 2015-12-09: qty 1

## 2015-12-09 MED ORDER — DIAZEPAM 5 MG PO TABS
10.0000 mg | ORAL_TABLET | Freq: Once | ORAL | Status: AC
  Administered 2015-12-09: 10 mg via ORAL
  Filled 2015-12-09: qty 2

## 2015-12-09 MED ORDER — DIAZEPAM 5 MG PO TABS: 5.0000 mg | ORAL_TABLET | Freq: Three times a day (TID) | ORAL | Status: DC

## 2015-12-09 MED ORDER — DEXAMETHASONE 4 MG PO TABS: 4.0000 mg | ORAL_TABLET | Freq: Two times a day (BID) | ORAL | Status: DC

## 2015-12-09 MED ORDER — AMOXICILLIN 500 MG PO CAPS: 500.0000 mg | ORAL_CAPSULE | Freq: Three times a day (TID) | ORAL | Status: DC

## 2015-12-09 MED ORDER — AMOXICILLIN 250 MG PO CAPS
500.0000 mg | ORAL_CAPSULE | Freq: Once | ORAL | Status: AC
  Administered 2015-12-09: 500 mg via ORAL
  Filled 2015-12-09: qty 2

## 2015-12-09 MED ORDER — TRAMADOL HCL 50 MG PO TABS: ORAL_TABLET | ORAL | Status: DC

## 2015-12-09 MED ORDER — BENAZEPRIL-HYDROCHLOROTHIAZIDE 20-25 MG PO TABS: 1.0000 | ORAL_TABLET | Freq: Every day | ORAL | Status: DC

## 2015-12-09 MED ORDER — KETOROLAC TROMETHAMINE 10 MG PO TABS
10.0000 mg | ORAL_TABLET | Freq: Once | ORAL | Status: AC
  Administered 2015-12-09: 10 mg via ORAL
  Filled 2015-12-09: qty 1

## 2015-12-09 NOTE — ED Notes (Signed)
Pt c/o lower back pain that radiates down left leg. Pt also c/o dental pain.

## 2015-12-09 NOTE — ED Provider Notes (Signed)
CSN: [id number]     Arrival date & time 12/09/15  2056 History   First MD Initiated Contact with Patient 12/09/15 2107     Chief Complaint  Patient presents with  . Back Pain     (Consider location/radiation/quality/duration/timing/severity/associated sxs/prior Treatment) Patient is a 35 y.o. male presenting with back pain. The history is provided by the patient.  Back Pain Location:  Lumbar spine Quality:  Aching and shooting Radiates to:  L thigh Pain severity:  Moderate Pain is:  Same all the time Onset quality:  Gradual Duration:  2 days Timing:  Intermittent Progression:  Worsening Chronicity: acute on chronic. Context comment:  Hx of DDD. turn/twist injury Relieved by:  Nothing Worsened by:  Movement and standing Ineffective treatments:  Bed rest and OTC medications Associated symptoms: headaches   Associated symptoms: no bladder incontinence, no bowel incontinence and no perianal numbness     Past Medical History  Diagnosis Date  . Hypertension   . Chronic back pain   . Migraine    Past Surgical History  Procedure Laterality Date  . Wrist surgery  right   History reviewed. No pertinent family history. Social History  Substance Use Topics  . Smoking status: Former Smoker -- 0.50 packs/day    Types: Cigarettes  . Smokeless tobacco: None  . Alcohol Use: No     Comment: 1-2 beers every weekend    Review of Systems  HENT: Positive for dental problem.   Gastrointestinal: Negative for bowel incontinence.  Genitourinary: Negative for bladder incontinence.  Musculoskeletal: Positive for back pain.  Neurological: Positive for headaches.  All other systems reviewed and are negative.     Allergies  Hydrocodone and Morphine and related  Home Medications   Prior to Admission medications   Medication Sig Start Date End Date Taking? Authorizing Provider  benazepril-hydrochlorthiazide (LOTENSIN HCT) 20-25 MG per tablet Take 1 tablet by mouth daily.   Yes  Historical Provider, MD   BP 164/106 mmHg  Pulse 70  Temp(Src) 98.4 F (36.9 C) (Oral)  Resp 18  Ht  (1.88 m)  Wt 129.275 kg  BMI 36.58 kg/m2  SpO2 97% Physical Exam  Constitutional: He is oriented to person, place, and time. He appears well-developed and well-nourished.  Non-toxic appearance.  HENT:  Head: Normocephalic.  Right Ear: Tympanic membrane and external ear normal.  Left Ear: Tympanic membrane and external ear normal.  There is a cavity of the left upper canine, with a broken tooth. There is no visible abscess, no drainage noted. No swelling under the tongue. The airway is patent.  Eyes: EOM and lids are normal. Pupils are equal, round, and reactive to light.  Neck: Normal range of motion. Neck supple. Carotid bruit is not present.  Cardiovascular: Normal rate, regular rhythm, normal heart sounds, intact distal pulses and normal pulses.   Pulmonary/Chest: Breath sounds normal. No respiratory distress.  Abdominal: Soft. Bowel sounds are normal. There is no tenderness. There is no guarding.  Musculoskeletal:       Lumbar back: He exhibits decreased range of motion, tenderness, pain and spasm.  There is left paraspinal area tenderness at the lumbar region on. This extends into the left buttocks and thigh. There's no palpable step off of the cervical, thoracic, or lumbar spine. There is mild spasm of the right paraspinal on muscles in the lumbar region.  Lymphadenopathy:       Head (right side): No submandibular adenopathy present.       Head (left side):  No submandibular adenopathy present.    He has no cervical adenopathy.  Neurological: He is alert and oriented to person, place, and time. He has normal strength. No cranial nerve deficit or sensory deficit.  There no sensory or motor deficits appreciated. No foot drop appreciated.  Skin: Skin is warm and dry.  Psychiatric: He has a normal mood and affect. His speech is normal.  Nursing note and vitals reviewed.   ED  Course  Procedures (including critical care time) Labs Review Labs Reviewed - No data to display  Imaging Review No results found. I have personally reviewed and evaluated these images and lab results as part of my medical decision-making.   EKG Interpretation None      MDM  The vital signs revealed an elevation in the blood pressure 164/106. The pulse oximetry is 97% on room air. There is a broken tooth related to a cavity of the left upper canine on region. There is no evidence of an abscess. There is paraspinal area tenderness and spasm in the lumbar region on. Patient has a history of degenerative disc disease. No evidence for caudal equina, or other emergency conditions at this time.  The Patient will be treated with Amoxil for his dental issue. Prescription for diazepam, diclofenac, and Decadron given for help with his back. The patient is also instructed to use a heating pad to his back. The patient's family asked about resources for assistance with getting seen concerning his back and his tooth, the patient is referred to the social worker, and resource guide was given.    Final diagnoses:  None    *I have reviewed nursing notes, vital signs, and all appropriate lab and imaging results for this patient.**    Ivery QualeHobson Kennedey Digilio, PA-C 12/09/15 2206  Vanetta MuldersScott Zackowski, MD 12/09/15 2209

## 2015-12-09 NOTE — Discharge Instructions (Signed)
Please call the social worker here at the hospital on tomorrow and inquire about resources that may be available to help with your medical and dental condition. Please use diazepam, Decadron, and diclofenac daily for your back. May use Ultram for more severe pain. Use Amoxil daily until all taken for the infected tooth on the left. [facility] to establish a primary care physician. Your blood pressure was elevated tonight, and should be monitored very closely. Sciatica Sciatica is pain, weakness, numbness, or tingling along your sciatic nerve. The nerve starts in the lower back and runs down the back of each leg. Nerve damage or certain conditions pinch or put pressure on the sciatic nerve. This causes the pain, weakness, and other discomforts of sciatica. HOME CARE   Only take medicine as told by your doctor.  Apply ice to the affected area for 20 minutes. Do this 3-4 times a day for the first 48-72 hours. Then try heat in the same way.  Exercise, stretch, or do your usual activities if these do not make your pain worse.  Go to physical therapy as told by your doctor.  Keep all doctor visits as told.  Do not wear high heels or shoes that are not supportive.  Get a firm mattress if your mattress is too soft to lessen pain and discomfort. GET HELP RIGHT AWAY IF:   You cannot control when you poop (bowel movement) or pee (urinate).  You have more weakness in your lower back, lower belly (pelvis), butt (buttocks), or legs.  You have redness or puffiness (swelling) of your back.  You have a burning feeling when you pee.  You have pain that gets worse when you lie down.  You have pain that wakes you from your sleep.  Your pain is worse than past pain.  Your pain lasts longer than 4 weeks.  You are suddenly losing weight without reason. MAKE SURE YOU:   Understand these instructions.  Will watch this condition.  Will get help right away if  you are not doing well or get worse.   This information is not intended to replace advice given to you by your health care provider. Make sure you discuss any questions you have with your health care provider.   Document Released: 03/29/2008 Document Revised: 03/11/2015 Document Reviewed: 10/30/2011 Elsevier Interactive Patient Education 2016 [employer].  Hypertension Hypertension is another name for high blood pressure. High blood pressure forces your heart to work harder to pump blood. A blood pressure reading has two numbers, which includes a higher number over a lower number (example: 110/72). HOME CARE   Have your blood pressure rechecked by your doctor.  Only take medicine as told by your doctor. Follow the directions carefully. The medicine does not work as well if you skip doses. Skipping doses also puts you at risk for problems.  Do not smoke.  Monitor your blood pressure at home as told by your doctor. GET HELP IF:  You think you are having a reaction to the medicine you are taking.  You have repeat headaches or feel dizzy.  You have puffiness (swelling) in your ankles.  You have trouble with your vision. GET HELP RIGHT AWAY IF:   You get a very bad headache and are confused.  You feel weak, numb, or faint.  You get chest or belly (abdominal) pain.  You throw up (vomit).  You cannot breathe very well. MAKE SURE YOU:   Understand  these instructions.  Will watch your condition.  Will get help right away if you are not doing well or get worse.   This information is not intended to replace advice given to you by your health care provider. Make sure you discuss any questions you have with your health care provider.   Document Released: 12/07/2007 Document Revised: 06/25/2013 Document Reviewed: 04/12/2013 Elsevier Interactive Patient Education 2016 Elsevier Inc.  Dental Caries Dental caries is tooth decay. This decay can cause a hole in teeth (cavity) that  can get bigger and deeper over time. HOME CARE  Brush and floss your teeth. Do this at least two times a day.  Use a fluoride toothpaste.  Use a mouth rinse if told by your dentist or doctor.  Eat less sugary and starchy foods. Drink less sugary drinks.  Avoid snacking often on sugary and starchy foods. Avoid sipping often on sugary drinks.  Keep regular checkups and cleanings with your dentist.  Use fluoride supplements if told by your dentist or doctor.  Allow fluoride to be applied to teeth if told by your dentist or doctor.   This information is not intended to replace advice given to you by your health care provider. Make sure you discuss any questions you have with your health care provider.   Document Released: 03/29/2008 Document Revised: 07/11/2014 Document Reviewed: 06/22/2012 Elsevier Interactive Patient Education 2016 [employer] Resource Guide Dental [employer] 211 is a great source of information about community services available.  Access by dialing 2-1-1 from anywhere in [place], or by website -  PooledIncome.plwww.nc211.org.   Other Local Resources (Updated 07/2015)  Dental  Care   Services    Phone Number and Address  Cost  [facility] For children 730 - 36 years of age:   Cleaning  Tooth brushing/flossing instruction  Sealants, fillings, crowns  Extractions  Emergency treatment  [phone] [address] [place], [place] [id number] Charges based on family income.  Medicaid and some insurance plans accepted.     [facility]  Cleaning  Sealants, fillings, crowns  Extractions  Emergency treatment [phone] [address] [place], [place]  Pregnant women 36 years of age or older with a Medicaid card  [facility] [place]  Cleaning  Sealants, fillings, crowns  Extractions  Emergency treatment  [phone] [address] [place], [place] Pregnant women 36 years of age or older with a Medicaid card  [facility] For children 710 - 321 years of age:   Cleaning  Tooth brushing/flossing instruction  Sealants, fillings, crowns  Extractions  Emergency treatment Limited orthodontic services for patients with Medicaid [id number] [address] [place], [place] [id number] Medicaid and [facility] Health Choice cover for children up to age 36 and pregnant women.  Parents of children up to age 36 without Medicaid pay a reduced fee at time of service.  [facility] [place] For children 300 - 36 years of age:   Cleaning  Tooth brushing/flossing instruction  Sealants, fillings, crowns  Extractions  Emergency treatment Limited orthodontic services for patients with Medicaid [phone] [address] [place], [place].  Medicaid and [place] Health Choice cover for children up to age 36 and pregnant women.  Parents of children up to age 36 without Medicaid pay a reduced fee.  [facility]  Cleaning  Sealants, fillings, crowns  Extractions  Hours: Tuesdays and Thursdays, 4:15 - 8 pm [phone] [address] [place], [place] [address] Services free of charge to [facility] residents ages 18-64 who do not have health insurance, Medicare, [place], or [place] benefits and fall within federal poverty guidelines  [employer]    Provides dental care in addition to primary medical care, nutritional counseling, and pharmacy:  Nurse, mental health, fillings, crowns  Extractions                  [phone] [facility], [address] [place], [place]  [phone] [facility], [address] [place], [place]  [phone] [facility] [place], [place]  [phone] [facility], [address] [place], [place]  [phone] [facility] [address] [place], [place] Accepts [place], [place], most insurance.  Also provides services available to all with fees adjusted based on ability to pay.    [facility]  Cleaning  Tooth brushing/flossing instruction  Sealants, fillings, crowns  Extractions  Emergency treatment Hours: Tuesdays, Thursdays, and Fridays from 8 am to 5 pm by appointment only. [phone] [address] [place], [place] [address] [facility] residents with Medicaid (depending on eligibility) and children with [facility] Health Choice - call for more information.  [facility]  Extractions only  Hours: 2nd and 4th Thursday of each month from 6:30 am - 9 am.   [phone]. [phone] [place], [place] [address] Ages 70 and older only.  Patients are seen on a first come, first served basis.  [employer]  [employer]  Extractions  Orthodontics  Endodontics  Implants/Crowns/Bridges  Complete and partial dentures [phone] [place], [place] Patients must complete an application for services.  There is often a waiting list.

## 2015-12-31 ENCOUNTER — Emergency Department (HOSPITAL_COMMUNITY)
Admission: EM | Admit: 2015-12-31 | Discharge: 2015-12-31 | Disposition: A | Discharge status: Home / Self Care | Payer: Self-pay | Attending: Emergency Medicine | Admitting: Emergency Medicine

## 2015-12-31 ENCOUNTER — Encounter (HOSPITAL_COMMUNITY): Payer: Self-pay | Admitting: Cardiology

## 2015-12-31 DIAGNOSIS — I10 Essential (primary) hypertension: Secondary | ICD-10-CM | POA: Insufficient documentation

## 2015-12-31 DIAGNOSIS — Z87891 Personal history of nicotine dependence: Secondary | ICD-10-CM | POA: Insufficient documentation

## 2015-12-31 DIAGNOSIS — R110 Nausea: Secondary | ICD-10-CM | POA: Insufficient documentation

## 2015-12-31 DIAGNOSIS — R739 Hyperglycemia, unspecified: Secondary | ICD-10-CM

## 2015-12-31 DIAGNOSIS — M5432 Sciatica, left side: Secondary | ICD-10-CM | POA: Insufficient documentation

## 2015-12-31 DIAGNOSIS — K029 Dental caries, unspecified: Secondary | ICD-10-CM | POA: Insufficient documentation

## 2015-12-31 DIAGNOSIS — Z79899 Other long term (current) drug therapy: Secondary | ICD-10-CM | POA: Insufficient documentation

## 2015-12-31 LAB — CBG MONITORING, ED: GLUCOSE-CAPILLARY: 240 mg/dL — AB (ref 65–99)

## 2015-12-31 MED ORDER — CLINDAMYCIN PHOSPHATE 300 MG/50ML IV SOLN
300.0000 mg | Freq: Once | INTRAVENOUS | Status: AC
  Administered 2015-12-31: 300 mg via INTRAVENOUS
  Filled 2015-12-31: qty 50

## 2015-12-31 MED ORDER — METFORMIN HCL 500 MG PO TABS: 500.0000 mg | ORAL_TABLET | Freq: Two times a day (BID) | ORAL | Status: DC

## 2015-12-31 MED ORDER — CARISOPRODOL 350 MG PO TABS: 350.0000 mg | ORAL_TABLET | Freq: Three times a day (TID) | ORAL | Status: DC

## 2015-12-31 MED ORDER — SODIUM CHLORIDE 0.9 % IV SOLN
INTRAVENOUS | Status: DC
  Administered 2015-12-31: 13:00:00 via INTRAVENOUS

## 2015-12-31 MED ORDER — ETODOLAC 500 MG PO TABS: 500.0000 mg | ORAL_TABLET | Freq: Two times a day (BID) | ORAL | Status: DC

## 2015-12-31 MED ORDER — CLINDAMYCIN HCL 150 MG PO CAPS: 150.0000 mg | ORAL_CAPSULE | Freq: Four times a day (QID) | ORAL | Status: DC

## 2015-12-31 MED ORDER — HYDROMORPHONE HCL 1 MG/ML IJ SOLN
1.0000 mg | Freq: Once | INTRAMUSCULAR | Status: AC
  Administered 2015-12-31: 1 mg via INTRAVENOUS
  Filled 2015-12-31: qty 1

## 2015-12-31 NOTE — ED Notes (Signed)
[facility] - 240 in triage.

## 2015-12-31 NOTE — Discharge Instructions (Signed)
Dental Caries °Dental caries (also called tooth decay) is the most common oral disease. It can occur at any age but is more common in children and young adults.  °HOW DENTAL CARIES DEVELOPS  °The process of decay begins when bacteria and foods (particularly sugars and starches) combine in your mouth to produce plaque. Plaque is a substance that sticks to the hard, outer surface of a tooth (enamel). The bacteria in plaque produce acids that attack enamel. These acids may also attack the root surface of a tooth (cementum) if it is exposed. Repeated attacks dissolve these surfaces and create holes in the tooth (cavities). If left untreated, the acids destroy the other layers of the tooth.  °RISK FACTORS °· Frequent sipping of sugary beverages.   °· Frequent snacking on sugary and starchy foods, especially those that easily get stuck in the teeth.   °· Poor oral hygiene.   °· Dry mouth.   °· Substance abuse such as methamphetamine abuse.   °· Broken or poor-fitting dental restorations.   °· Eating disorders.   °· Gastroesophageal reflux disease (GERD).   °· Certain radiation treatments to the head and neck. °SYMPTOMS °In the early stages of dental caries, symptoms are seldom present. Sometimes white, chalky areas may be seen on the enamel or other tooth layers. In later stages, symptoms may include: °· Pits and holes on the enamel. °· Toothache after sweet, hot, or cold foods or drinks are consumed. °· Pain around the tooth. °· Swelling around the tooth. °DIAGNOSIS  °Most of the time, dental caries is detected during a regular dental checkup. A diagnosis is made after a thorough medical and dental history is taken and the surfaces of your teeth are checked for signs of dental caries. Sometimes special instruments, such as lasers, are used to check for dental caries. Dental X-ray exams may be taken so that areas not visible to the eye (such as between the contact areas of the teeth) can be checked for cavities.    °TREATMENT  °If dental caries is in its early stages, it may be reversed with a fluoride treatment or an application of a remineralizing agent at the dental office. Thorough brushing and flossing at home is needed to aid these treatments. If it is in its later stages, treatment depends on the location and extent of tooth destruction:  °· If a small area of the tooth has been destroyed, the destroyed area will be removed and cavities will be filled with a material such as gold, silver amalgam, or composite resin.   °· If a large area of the tooth has been destroyed, the destroyed area will be removed and a cap (crown) will be fitted over the remaining tooth structure.   °· If the center part of the tooth (pulp) is affected, a procedure called a root canal will be needed before a filling or crown can be placed.   °· If most of the tooth has been destroyed, the tooth may need to be pulled (extracted). °HOME CARE INSTRUCTIONS °You can prevent, stop, or reverse dental caries at home by practicing good oral hygiene. Good oral hygiene includes: °· Thoroughly cleaning your teeth at least twice a day with a toothbrush and dental floss.   °· Using a fluoride toothpaste. A fluoride mouth rinse may also be used if recommended by your dentist or health care provider.   °· Restricting the amount of sugary and starchy foods and sugary liquids you consume.   °· Avoiding frequent snacking on these foods and sipping of these liquids.   °· Keeping regular visits with   a dentist for checkups and cleanings. PREVENTION   Practice good oral hygiene.  Consider a dental sealant. A dental sealant is a coating material that is applied by your dentist to the pits and grooves of teeth. The sealant prevents food from being trapped in them. It may protect the teeth for several years.  Ask about fluoride supplements if you live in a community without fluorinated water or with water that has a low fluoride content. Use fluoride supplements  as directed by your dentist or health care provider.  Allow fluoride varnish applications to teeth if directed by your dentist or health care provider.   This information is not intended to replace advice given to you by your health care provider. Make sure you discuss any questions you have with your health care provider.   Document Released: 03/12/2002 Document Revised: 07/11/2014 Document Reviewed: 06/22/2012 Elsevier Interactive Patient Education 2016 Elsevier Inc.  Sciatica Sciatica is pain, weakness, numbness, or tingling along the path of the sciatic nerve. The nerve starts in the lower back and runs down the back of each leg. The nerve controls the muscles in the lower leg and in the back of the knee, while also providing sensation to the back of the thigh, lower leg, and the sole of your foot. Sciatica is a symptom of another medical condition. For instance, nerve damage or certain conditions, such as a herniated disk or bone spur on the spine, pinch or put pressure on the sciatic nerve. This causes the pain, weakness, or other sensations normally associated with sciatica. Generally, sciatica only affects one side of the body. CAUSES   Herniated or slipped disc.  Degenerative disk disease.  A pain disorder involving the narrow muscle in the buttocks (piriformis syndrome).  Pelvic injury or fracture.  Pregnancy.  Tumor (rare). SYMPTOMS  Symptoms can vary from mild to very severe. The symptoms usually travel from the low back to the buttocks and down the back of the leg. Symptoms can include:  Mild tingling or dull aches in the lower back, leg, or hip.  Numbness in the back of the calf or sole of the foot.  Burning sensations in the lower back, leg, or hip.  Sharp pains in the lower back, leg, or hip.  Leg weakness.  Severe back pain inhibiting movement. These symptoms may get worse with coughing, sneezing, laughing, or prolonged sitting or standing. Also, being  overweight may worsen symptoms. DIAGNOSIS  Your caregiver will perform a physical exam to look for common symptoms of sciatica. He or she may ask you to do certain movements or activities that would trigger sciatic nerve pain. Other tests may be performed to find the cause of the sciatica. These may include:  Blood tests.  X-rays.  Imaging tests, such as an MRI or CT scan. TREATMENT  Treatment is directed at the cause of the sciatic pain. Sometimes, treatment is not necessary and the pain and discomfort goes away on its own. If treatment is needed, your caregiver may suggest:  Over-the-counter medicines to relieve pain.  Prescription medicines, such as anti-inflammatory medicine, muscle relaxants, or narcotics.  Applying heat or ice to the painful area.  Steroid injections to lessen pain, irritation, and inflammation around the nerve.  Reducing activity during periods of pain.  Exercising and stretching to strengthen your abdomen and improve flexibility of your spine. Your caregiver may suggest losing weight if the extra weight makes the back pain worse.  Physical therapy.  Surgery to eliminate what is pressing  or pinching the nerve, such as a bone spur or part of a herniated disk. HOME CARE INSTRUCTIONS   Only take over-the-counter or prescription medicines for pain or discomfort as directed by your caregiver.  Apply ice to the affected area for 20 minutes, 3-4 times a day for the first 48-72 hours. Then try heat in the same way.  Exercise, stretch, or perform your usual activities if these do not aggravate your pain.  Attend physical therapy sessions as directed by your caregiver.  Keep all follow-up appointments as directed by your caregiver.  Do not wear high heels or shoes that do not provide proper support.  Check your mattress to see if it is too soft. A firm mattress may lessen your pain and discomfort. SEEK IMMEDIATE MEDICAL CARE IF:   You lose control of your  bowel or bladder (incontinence).  You have increasing weakness in the lower back, pelvis, buttocks, or legs.  You have redness or swelling of your back.  You have a burning sensation when you urinate.  You have pain that gets worse when you lie down or awakens you at night.  Your pain is worse than you have experienced in the past.  Your pain is lasting longer than 4 weeks.  You are suddenly losing weight without reason. MAKE SURE YOU:  Understand these instructions.  Will watch your condition.  Will get help right away if you are not doing well or get worse.   This information is not intended to replace advice given to you by your health care provider. Make sure you discuss any questions you have with your health care provider.   Document Released: 06/14/2001 Document Revised: 03/11/2015 Document Reviewed: 10/30/2011 Elsevier Interactive Patient Education 2016 [employer] 211 is a great source of information about community services available.  Access by dialing 2-1-1 from anywhere in [place], or by website -  PooledIncome.plwww.nc211.org.   Other Local Resources (Updated 07/2015)  Dental  Care   Services    Phone Number and Address  Cost  [facility] For children 270 - 36 years of age:   Cleaning  Tooth brushing/flossing instruction  Sealants, fillings, crowns  Extractions  Emergency treatment  [phone] [address] [place], [place] [address] Charges based on family income.  Medicaid and some insurance plans accepted.     [facility] [place]  Cleaning  Sealants, fillings, crowns  Extractions  Emergency treatment [phone] [address] [place], [place]  Pregnant women 36 years of age or older with a Medicaid card  [facility] [place]  Cleaning  Sealants, fillings,  crowns  Extractions  Emergency treatment [phone] [address] [place], [place] Pregnant women 36 years of age or older with a Medicaid card  [facility] For children 650 - 36 years of age:   Cleaning  Tooth brushing/flossing instruction  Sealants, fillings, crowns  Extractions  Emergency treatment Limited orthodontic services for patients with Medicaid [phone] [address] [place], [place] [address] Medicaid and [facility] Health Choice cover for children up to age 36 and pregnant women.  Parents of children up to age 36 without Medicaid pay a reduced fee at time of service.  [facility] [place] For children 260 - 36 years of age:   Cleaning  Tooth brushing/flossing instruction  Sealants, fillings,  crowns  Extractions  Emergency treatment Limited orthodontic services for patients with Medicaid [phone] [address] [place], [place].  Medicaid and [place] Health Choice cover for children up to age 47 and pregnant women.  Parents of children up to age 67 without Medicaid pay a reduced fee.  [facility]  Sealants, fillings, crowns  Extractions  Hours: Tuesdays and Thursdays, 4:15 - 8 pm [phone] [address] [place], [place] [address] Services free of charge to [facility] residents ages 18-64 who do not have health insurance, Medicare, [place], or [place] benefits and fall within federal poverty guidelines  [employer]    Provides dental care in addition to primary medical care, nutritional counseling, and pharmacy:  Nurse, mental health, fillings, crowns  Extractions                  [phone] [facility], [address] [place] [address] [place], [place]  [phone] [facility], [address] [place],  [place]  [phone] [facility] [place], [place]  [phone] [facility], [address] [place], [place]  [phone] [facility] [address] [place], [place] Accepts [place], [place], most insurance.  Also provides services available to all with fees adjusted based on ability to pay.    [facility]  Cleaning  Tooth brushing/flossing instruction  Sealants, fillings, crowns  Extractions  Emergency treatment Hours: Tuesdays, Thursdays, and Fridays from 8 am to 5 pm by appointment only. [phone] [address] [place], [place] [address] [facility] residents with Medicaid (depending on eligibility) and children with [facility] Health Choice - call for more information.  [facility]  Extractions only  Hours: 2nd and 4th Thursday of each month from 6:30 am - 9 am.   [phone]. [phone] [place], [place] [address] Ages 59 and older only.  Patients are seen on a first come, first served basis.  [employer]  [employer]  Extractions  Orthodontics  Endodontics  Implants/Crowns/Bridges  Complete and partial dentures [phone] [place], [place] Patients must complete an application for services.  There is often a waiting list.

## 2015-12-31 NOTE — ED Provider Notes (Signed)
CSN: [id number]     Arrival date & time 12/31/15  [id number] History  By signing my name below, I, Majel Homereyton [name], attest that this documentation has been prepared under the direction and in the presence of Linwood DibblesJon Vint Pola, MD . Electronically Signed: Majel HomerPeyton [name], Scribe. 12/31/2015. 12:19 PM.   Chief Complaint  Patient presents with  . Back Pain   Patient is a 35 y.o. male presenting with back pain. The history is provided by the patient. No language interpreter was used.  Back Pain Location:  Lumbar spine Quality:  Shooting Radiates to:  L posterior upper leg Pain severity:  Mild Onset quality:  Gradual Duration:  2 weeks Progression:  Unchanged Chronicity:  Chronic Relieved by:  Nothing Worsened by:  Nothing tried Ineffective treatments:  Narcotics  HPI Comments: Corey [name] is a 35 y.o. male with PMHx of HTN and chronic back pain, who presents to the Emergency Department complaining of gradually worsening, upper dental pain s/p a visit to the ED on 12/09/15 for similar symptoms. Pt states he was given amoxacillin and tramadol for his pain but has felt no improvement. Pt notes he is planning to see a dentist soon. 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 Pt also complains of radiating, lumbar back pain due to chronic back pain and sciatica. He states his back pain radiates down his left leg. He reports he has seen a spinal doctor in the past and received 2 cortisol shots. He reports associated symptoms of nausea. He denies vomiting, fever, and bladder or bowel incontinence. Pt reports tramadol caused him to have hiccups for 10 consecutive days. He also states he experiences a side effect of itchiness when taking morphine.   Past Medical History  Diagnosis Date  . Hypertension   . Chronic back pain   . Migraine    Past Surgical History  Procedure Laterality Date  . Wrist surgery  right   History reviewed. No  pertinent family history. Social History  Substance Use Topics  . Smoking status: Former Smoker -- 0.50 packs/day    Types: Cigarettes  . Smokeless tobacco: None  . Alcohol Use: No     Comment: 1-2 beers every weekend    Review of Systems  HENT: Positive for dental problem.   Gastrointestinal: Positive for nausea. Negative for vomiting.  Genitourinary: Negative for difficulty urinating.  Musculoskeletal: Positive for back pain.   Allergies  Hydrocodone and Morphine and related  Home Medications   Prior to Admission medications   Medication Sig Start Date End Date Taking? Authorizing Provider  amoxicillin (AMOXIL) 500 MG capsule Take 1 capsule (500 mg total) by mouth 3 (three) times daily. 12/09/15  Yes Ivery QualeHobson Bryant, PA-C  benazepril-hydrochlorthiazide (LOTENSIN HCT) 20-25 MG tablet Take 1 tablet by mouth daily. 12/09/15  Yes Ivery QualeHobson Bryant, PA-C  diazepam (VALIUM) 5 MG tablet Take 1 tablet (5 mg total) by mouth 3 (three) times daily. For back pain/spasm 12/09/15  Yes Ivery QualeHobson Bryant, PA-C  traMADol (ULTRAM) 50 MG tablet 1 or 2 po q6h prn pain 12/09/15  Yes Ivery QualeHobson Bryant, PA-C  carisoprodol (SOMA) 350 MG tablet Take 1 tablet (350 mg total) by mouth 3 (three) times daily. 12/31/15   Linwood DibblesJon Kissa Campoy, MD  clindamycin (CLEOCIN) 150 MG capsule Take 1 capsule (150 mg total) by mouth every 6 (six) hours. 12/31/15   Linwood DibblesJon Jeriah Skufca, MD  etodolac (LODINE) 500 MG tablet  Take 1 tablet (500 mg total) by mouth 2 (two) times daily. 12/31/15   Linwood DibblesJon Haroldine Redler, MD  metFORMIN (GLUCOPHAGE) 500 MG tablet Take 1 tablet (500 mg total) by mouth 2 (two) times daily with a meal. 12/31/15   Linwood DibblesJon Gracelynn Bircher, MD   BP 136/67 mmHg  Pulse 76  Temp(Src) 98.3 F (36.8 C) (Oral)  Resp 17  Ht 6\' 3"  (1.905 m)  Wt 129.275 kg  BMI 35.62 kg/m2  SpO2 99% Physical Exam  Constitutional: He appears well-developed and well-nourished. No distress.  HENT:  Head: Normocephalic and atraumatic.  Right Ear: External ear normal.  Left Ear: External ear  normal.  Mouth/Throat: No trismus in the jaw. Dental caries present. No uvula swelling.  Mild amount of swelling left maxillary area and decayed tooth left upper maxillary region   Eyes: Conjunctivae are normal. Right eye exhibits no discharge. Left eye exhibits no discharge. No scleral icterus.  Neck: Neck supple. No tracheal deviation present.  Cardiovascular: Normal rate, regular rhythm and intact distal pulses.   Pulmonary/Chest: Effort normal and breath sounds normal. No stridor. No respiratory distress. He has no wheezes. He has no rales.  Abdominal: Soft. Bowel sounds are normal. He exhibits no distension. There is no tenderness. There is no rebound and no guarding.  Musculoskeletal: He exhibits no edema or tenderness.       Lumbar back: Normal.  5/5 strength bilateral lower extremities  Plantar and dorsal flexion  Normal sensations   Neurological: He is alert. He has normal strength. No cranial nerve deficit (no facial droop, extraocular movements intact, no slurred speech) or sensory deficit. He exhibits normal muscle tone. He displays no seizure activity. Coordination normal.  Skin: Skin is warm and dry. No rash noted.  Psychiatric: He has a normal mood and affect.  Nursing note and vitals reviewed.   ED Course  Procedures  DIAGNOSTIC STUDIES:  Oxygen Saturation is 95% on RA, adequate by my interpretation.    COORDINATION OF CARE:  12:06 PM Discussed treatment plan, which includes IV clindamycin with pt at bedside and pt agreed to plan.  Labs Review Labs Reviewed  CBG MONITORING, ED - Abnormal; Notable for the following:    Glucose-Capillary 240 (*)    All other components within normal limits   Medications given in the ED Medications  0.9 %  sodium chloride infusion ( Intravenous New Bag/Given 12/31/15 1253)  clindamycin (CLEOCIN) IVPB 300 mg (300 mg Intravenous New Bag/Given 12/31/15 1226)  HYDROmorphone (DILAUDID) injection 1 mg (1 mg Intravenous Given 12/31/15 1226)     MDM   Final diagnoses:  Sciatica of left side  Dental caries  Hyperglycemia    I personally performed the services described in this documentation, which was scribed in my presence.  The recorded information has been reviewed and is accurate.    Linwood DibblesJon Dearies Meikle, MD 12/31/15 [phone]

## 2015-12-31 NOTE — ED Notes (Signed)
Here 2 weeks ago for back pain and dental pain.  States he is no better.

## 2016-05-18 ENCOUNTER — Emergency Department (HOSPITAL_COMMUNITY)
Admission: EM | Admit: 2016-05-18 | Discharge: 2016-05-18 | Disposition: A | Discharge status: Home / Self Care | Payer: BLUE CROSS/BLUE SHIELD | Attending: Emergency Medicine | Admitting: Emergency Medicine

## 2016-05-18 ENCOUNTER — Encounter (HOSPITAL_COMMUNITY): Payer: Self-pay

## 2016-05-18 DIAGNOSIS — Z7984 Long term (current) use of oral hypoglycemic drugs: Secondary | ICD-10-CM | POA: Insufficient documentation

## 2016-05-18 DIAGNOSIS — Z79899 Other long term (current) drug therapy: Secondary | ICD-10-CM | POA: Diagnosis not present

## 2016-05-18 DIAGNOSIS — I10 Essential (primary) hypertension: Secondary | ICD-10-CM | POA: Diagnosis not present

## 2016-05-18 DIAGNOSIS — M5442 Lumbago with sciatica, left side: Secondary | ICD-10-CM | POA: Insufficient documentation

## 2016-05-18 DIAGNOSIS — M549 Dorsalgia, unspecified: Secondary | ICD-10-CM | POA: Diagnosis present

## 2016-05-18 DIAGNOSIS — Z87891 Personal history of nicotine dependence: Secondary | ICD-10-CM | POA: Insufficient documentation

## 2016-05-18 MED ORDER — DIAZEPAM 5 MG PO TABS: 5.0000 mg | ORAL_TABLET | Freq: Three times a day (TID) | ORAL | 0 refills | Status: DC | PRN

## 2016-05-18 MED ORDER — PREDNISONE 20 MG PO TABS: 60.0000 mg | ORAL_TABLET | Freq: Every day | ORAL | 0 refills | Status: AC

## 2016-05-18 MED ORDER — DICLOFENAC SODIUM 1 % TD GEL: 2.0000 g | Freq: Four times a day (QID) | TRANSDERMAL | 0 refills | Status: DC

## 2016-05-18 MED ORDER — METHOCARBAMOL 500 MG PO TABS
1000.0000 mg | ORAL_TABLET | Freq: Once | ORAL | Status: AC
  Administered 2016-05-18: 1000 mg via ORAL
  Filled 2016-05-18: qty 2

## 2016-05-18 MED ORDER — PREDNISONE 10 MG PO TABS
60.0000 mg | ORAL_TABLET | Freq: Once | ORAL | Status: AC
  Administered 2016-05-18: 60 mg via ORAL
  Filled 2016-05-18: qty 1

## 2016-05-18 MED ORDER — OXYCODONE-ACETAMINOPHEN 5-325 MG PO TABS
1.0000 | ORAL_TABLET | Freq: Once | ORAL | Status: AC
  Administered 2016-05-18: 1 via ORAL
  Filled 2016-05-18: qty 1

## 2016-05-18 NOTE — ED Triage Notes (Signed)
Pt reports history of back pain and reports pain flared up in lower back 3 days ago.  Reports pain radiates down left leg.  Denies any problems with bowels or bladder.

## 2016-05-18 NOTE — Discharge Instructions (Signed)
Please read and follow all provided instructions.  Your diagnoses today include:  1. Acute left-sided low back pain with left-sided sciatica    Tests performed today include: Vital signs. See below for your results today.   Medications prescribed:  Take as prescribed   Home care instructions:  Follow any educational materials contained in this packet.  Follow-up instructions: Please follow-up with your primary care provider for further evaluation of symptoms and treatment   Return instructions:  Please return to the Emergency Department if you do not get better, if you get worse, or new symptoms OR  - Fever (temperature greater than 101.9F)  - Bleeding that does not stop with holding pressure to the area    -Severe pain (please note that you may be more sore the day after your accident)  - Chest Pain  - Difficulty breathing  - Severe nausea or vomiting  - Inability to tolerate food and liquids  - Passing out  - Skin becoming red around your wounds  - Change in mental status (confusion or lethargy)  - New numbness or weakness    Please return if you have any other emergent concerns.  Additional Information:  Your vital signs today were: BP 157/96 (BP Location: Left Arm)    Pulse 68    Temp 97.9 F (36.6 C) (Oral)    Resp 22    Ht 6\' 3"  (1.905 m)    Wt 127 kg    SpO2 97%    BMI 35.00 kg/m  If your blood pressure (BP) was elevated above 135/85 this visit, please have this repeated by your doctor within one month. ---------------

## 2016-05-18 NOTE — ED Provider Notes (Signed)
[facility] DEPT Provider Note   CSN: [id number] Arrival date & time: 05/18/16  1511     History   Chief Complaint Chief Complaint  Patient presents with  . Back Pain    HPI Corey [name] is a 35 y.o. male.  HPI  35 y.o. male with a hx of Chronic back pain, HTN, presents to the Emergency Department today complaining of back pain x 3 days. Notes lifting gas jug during that time and feeling pull in muscle. Notes immediate pain. Rates pain 7/10. Worse with movement. No loss of bowel or bladder function. No saddle anesthesia. Does not pain that radiates down from left leg, which is typical for his sciatic pain. No N/V. No fevers. No CP/SOB/ABD pain. No other symptoms noted.    Past Medical History:  Diagnosis Date  . Chronic back pain   . Hypertension   . Migraine     There are no active problems to display for this patient.   Past Surgical History:  Procedure Laterality Date  . WRIST SURGERY  right       Home Medications    Prior to Admission medications   Medication Sig Start Date End Date Taking? Authorizing Provider  amoxicillin (AMOXIL) 500 MG capsule Take 1 capsule (500 mg total) by mouth 3 (three) times daily. 12/09/15   Ivery QualeHobson Bryant, PA-C  benazepril-hydrochlorthiazide (LOTENSIN HCT) 20-25 MG tablet Take 1 tablet by mouth daily. 12/09/15   Ivery QualeHobson Bryant, PA-C  carisoprodol (SOMA) 350 MG tablet Take 1 tablet (350 mg total) by mouth 3 (three) times daily. 12/31/15   Linwood DibblesJon Knapp, MD  clindamycin (CLEOCIN) 150 MG capsule Take 1 capsule (150 mg total) by mouth every 6 (six) hours. 12/31/15   Linwood DibblesJon Knapp, MD  diazepam (VALIUM) 5 MG tablet Take 1 tablet (5 mg total) by mouth 3 (three) times daily. For back pain/spasm 12/09/15   Ivery QualeHobson Bryant, PA-C  etodolac (LODINE) 500 MG tablet Take 1 tablet (500 mg total) by mouth 2 (two) times daily. 12/31/15   Linwood DibblesJon Knapp, MD  metFORMIN (GLUCOPHAGE) 500 MG tablet Take 1 tablet (500 mg total) by mouth 2 (two) times daily with a meal. 12/31/15    Linwood DibblesJon Knapp, MD  traMADol Janean Sark(ULTRAM) 50 MG tablet 1 or 2 po q6h prn pain 12/09/15   Ivery QualeHobson Bryant, PA-C    Family History No family history on file.  Social History Social History  Substance Use Topics  . Smoking status: Former Smoker    Packs/day: 0.50    Types: Cigarettes  . Smokeless tobacco: Never Used  . Alcohol use No     Allergies   Hydrocodone and Morphine and related   Review of Systems Review of Systems  Constitutional: Negative for fever.  Respiratory: Negative for shortness of breath.   Cardiovascular: Negative for chest pain.  Gastrointestinal: Negative for nausea and vomiting.  Genitourinary: Negative for difficulty urinating.  Musculoskeletal: Positive for back pain. Negative for gait problem.  Neurological: Negative for weakness and numbness.   Physical Exam Updated Vital Signs BP 157/96 (BP Location: Left Arm)   Pulse 68   Temp 97.9 F (36.6 C) (Oral)   Resp 22   Ht 6\' 3"  (1.905 m)   Wt 127 kg   SpO2 97%   BMI 35.00 kg/m   Physical Exam  Constitutional: He is oriented to person, place, and time. Vital signs are normal. He appears well-developed and well-nourished.  HENT:  Head: Normocephalic.  Right Ear: Hearing normal.  Left Ear: Hearing normal.  Eyes: Conjunctivae and EOM are normal. Pupils are equal, round, and reactive to light.  Neck: Normal range of motion. Neck supple.  Cardiovascular: Normal rate, regular rhythm and normal heart sounds.   Pulmonary/Chest: Effort normal and breath sounds normal.  Musculoskeletal: Normal range of motion.  TTP left lower lumbar musculature. No C/T/L spine tenderness. Pt able to ambulate. ROM intact.   Neurological: He is alert and oriented to person, place, and time.  Skin: Skin is warm and dry.  Psychiatric: He has a normal mood and affect. His speech is normal and behavior is normal. Thought content normal.  Nursing note and vitals reviewed.  ED Treatments / Results  Labs (all labs ordered are  listed, but only abnormal results are displayed) Labs Reviewed - No data to display  EKG  EKG Interpretation None       Radiology No results found.  Procedures Procedures (including critical care time)  Medications Ordered in ED Medications - No data to display   Initial Impression / Assessment and Plan / ED Course  I have reviewed the triage vital signs and the nursing notes.  Pertinent labs & imaging results that were available during my care of the patient were reviewed by me and considered in my medical decision making (see chart for details).  Clinical Course    Final Clinical Impressions(s) / ED Diagnoses  I have reviewed the relevant previous healthcare records. I obtained HPI from historian.  ED Course:  Assessment: Patient is a 35yM with a hx of chronic back pain who presents to the ED with back pain x 3 days. Noted exacerbating existing injury with lifting. No neurological deficits appreciated. Patient is ambulatory. No warning symptoms of back pain including: fecal incontinence, urinary retention or overflow incontinence, night sweats, waking from sleep with back pain, unexplained fevers or weight loss, h/o cancer, IVDU, recent trauma. No concern for cauda equina, epidural abscess, or other serious cause of back pain. Conservative measures such as rest, ice/heat and pain medicine indicated with PCP follow-up if no improvement with conservative management. Given decadron, valium, and Ibuprofen. I have reviewed the [place] Controlled Substance Reporting System. Given Rx 10 valium. Steroid burst. Follow up to PCP. At time of discharge, Patient is in no acute distress. Vital Signs are stable. Patient is able to ambulate. Patient able to tolerate PO.   Disposition/Plan:  DC Home Additional Verbal discharge instructions given and discussed with patient.  Pt Instructed to f/u with PCP in the next week for evaluation and treatment of symptoms. Return precautions  given Pt acknowledges and agrees with plan  Supervising Physician Samuel JesterKathleen McManus, DO   Final diagnoses:  Acute left-sided low back pain with left-sided sciatica    New Prescriptions New Prescriptions   No medications on file     Audry Piliyler Shashank Kwasnik, PA-C 05/18/16 1537    Samuel JesterKathleen McManus, DO 05/18/16 1617

## 2016-07-25 ENCOUNTER — Encounter (HOSPITAL_COMMUNITY): Payer: Self-pay

## 2016-07-25 ENCOUNTER — Emergency Department (HOSPITAL_COMMUNITY)
Admission: EM | Admit: 2016-07-25 | Discharge: 2016-07-26 | Disposition: A | Discharge status: Home / Self Care | Payer: BLUE CROSS/BLUE SHIELD | Attending: Emergency Medicine | Admitting: Emergency Medicine

## 2016-07-25 ENCOUNTER — Emergency Department (HOSPITAL_COMMUNITY): Payer: BLUE CROSS/BLUE SHIELD

## 2016-07-25 DIAGNOSIS — J40 Bronchitis, not specified as acute or chronic: Secondary | ICD-10-CM | POA: Insufficient documentation

## 2016-07-25 DIAGNOSIS — J9801 Acute bronchospasm: Secondary | ICD-10-CM | POA: Diagnosis not present

## 2016-07-25 DIAGNOSIS — Z87891 Personal history of nicotine dependence: Secondary | ICD-10-CM | POA: Diagnosis not present

## 2016-07-25 DIAGNOSIS — Z79899 Other long term (current) drug therapy: Secondary | ICD-10-CM | POA: Insufficient documentation

## 2016-07-25 DIAGNOSIS — R112 Nausea with vomiting, unspecified: Secondary | ICD-10-CM | POA: Diagnosis not present

## 2016-07-25 DIAGNOSIS — I10 Essential (primary) hypertension: Secondary | ICD-10-CM | POA: Diagnosis not present

## 2016-07-25 DIAGNOSIS — R0602 Shortness of breath: Secondary | ICD-10-CM | POA: Diagnosis present

## 2016-07-25 MED ORDER — ALBUTEROL SULFATE (2.5 MG/3ML) 0.083% IN NEBU
2.5000 mg | INHALATION_SOLUTION | Freq: Once | RESPIRATORY_TRACT | Status: AC
  Administered 2016-07-25: 2.5 mg via RESPIRATORY_TRACT
  Filled 2016-07-25: qty 3

## 2016-07-25 MED ORDER — PREDNISONE 10 MG PO TABS
60.0000 mg | ORAL_TABLET | Freq: Once | ORAL | Status: AC
  Administered 2016-07-25: 60 mg via ORAL
  Filled 2016-07-25: qty 1

## 2016-07-25 MED ORDER — IPRATROPIUM-ALBUTEROL 0.5-2.5 (3) MG/3ML IN SOLN
3.0000 mL | Freq: Once | RESPIRATORY_TRACT | Status: AC
  Administered 2016-07-25: 3 mL via RESPIRATORY_TRACT
  Filled 2016-07-25: qty 3

## 2016-07-25 MED ORDER — HYDROCOD POLST-CPM POLST ER 10-8 MG/5ML PO SUER
5.0000 mL | Freq: Once | ORAL | Status: AC
  Administered 2016-07-25: 5 mL via ORAL
  Filled 2016-07-25: qty 5

## 2016-07-25 NOTE — ED Triage Notes (Signed)
Patient states that he has been nauseated, vomiting from coughing up phlegm. States that he is having pressure in his chest.  Everyone else at home has been sick, but they got over it.  Patient states that he has not been taking medications for his blood pressure everyday.

## 2016-07-25 NOTE — ED Triage Notes (Signed)
Having chills, cough, feels like something is poking me in the chest.  Started with a sore throat.  Having some shortness of breath.

## 2016-07-26 MED ORDER — ALBUTEROL SULFATE HFA 108 (90 BASE) MCG/ACT IN AERS
2.0000 | INHALATION_SPRAY | Freq: Once | RESPIRATORY_TRACT | Status: AC
  Administered 2016-07-26: 2 via RESPIRATORY_TRACT
  Filled 2016-07-26: qty 6.7

## 2016-07-26 MED ORDER — AZITHROMYCIN 250 MG PO TABS
ORAL_TABLET | ORAL | 0 refills | Status: DC
Start: 2016-07-26 — End: 2016-12-12

## 2016-07-26 MED ORDER — GUAIFENESIN-CODEINE 100-10 MG/5ML PO SYRP: 10.0000 mL | ORAL_SOLUTION | Freq: Three times a day (TID) | ORAL | 0 refills | Status: DC | PRN

## 2016-07-26 NOTE — ED Notes (Signed)
Pt verbalized understanding of discharge instructions. Pt ambulatory to waiting room.  

## 2016-07-26 NOTE — Discharge Instructions (Signed)
2 puffs of the inhaler 4 times a day as needed. Drink plenty of fluids.  Alternate tylenol and ibuprofen every 4-6 hrs for fever or body aches.  Follow-up with your doctor for recheck or return here for any worsening symtpoms

## 2016-07-27 NOTE — ED Provider Notes (Signed)
[facility] Provider Note   CSN: [id number] Arrival date & time: 07/25/16  2121     History   Chief Complaint Chief Complaint  Patient presents with  . Shortness of Breath    HPI Corey [name] is a 36 y.o. male.  HPI  Corey [name] is a 36 y.o. male who presents to the Emergency Department complaining of cough, chest tightness and shortness of breath associated with coughing.  Symptoms have been present for several days.  He also has associated nausea, post-tussive emesis, nasal congestion, chills and sore throat.  He admits sick contacts with other family members.  He denies abdominal pain, known fever at home.  He has tried OTC cold and cough medications without relief.   Past Medical History:  Diagnosis Date  . Chronic back pain   . Hypertension   . Migraine     There are no active problems to display for this patient.   Past Surgical History:  Procedure Laterality Date  . WRIST SURGERY  right       Home Medications    Prior to Admission medications   Medication Sig Start Date End Date Taking? Authorizing Provider  benazepril-hydrochlorthiazide (LOTENSIN HCT) 20-25 MG tablet Take 1 tablet by mouth daily. 12/09/15  Yes Ivery Quale, PA-C  DM-Doxylamine-Acetaminophen (NYQUIL COLD & FLU PO) Take 15-30 mLs by mouth daily as needed (for cold symptoms).   Yes Historical Provider, MD  Phenylephrine-DM-GG-APAP (MUCINEX FAST-MAX COLD FLU) 5-10-200-325 MG/10ML LIQD Take 15 mLs by mouth daily as needed (for cold symptoms).   Yes Historical Provider, MD  azithromycin (ZITHROMAX) 250 MG tablet Take first 2 tablets together, then 1 every day until finished. 07/26/16   Jessamine Barcia, PA-C  guaiFENesin-codeine (ROBITUSSIN AC) 100-10 MG/5ML syrup Take 10 mLs by mouth 3 (three) times daily as needed. 07/26/16   Sharona Rovner, PA-C    Family History No family history on file.  Social History Social History  Substance Use Topics  . Smoking status: Former Smoker   Packs/day: 0.50    Types: Cigarettes  . Smokeless tobacco: Never Used  . Alcohol use No     Allergies   Morphine and related   Review of Systems Review of Systems  Constitutional: Positive for chills. Negative for activity change, appetite change and fever.  HENT: Positive for congestion and sore throat. Negative for facial swelling, rhinorrhea and trouble swallowing.   Eyes: Negative for visual disturbance.  Respiratory: Positive for cough, chest tightness and shortness of breath. Negative for wheezing and stridor.   Gastrointestinal: Positive for nausea and vomiting. Negative for abdominal pain.  Genitourinary: Negative for dysuria.  Musculoskeletal: Positive for myalgias. Negative for neck pain and neck stiffness.  Skin: Negative.  Negative for rash.  Neurological: Negative for dizziness, weakness, numbness and headaches.  Hematological: Negative for adenopathy.  Psychiatric/Behavioral: Negative for confusion.  All other systems reviewed and are negative.    Physical Exam Updated Vital Signs BP (!) 143/101   Pulse 94   Temp 99.1 F (37.3 C) (Oral)   Resp 18   Ht 6\' 2"  (1.88 m)   Wt 127 kg   SpO2 98%   BMI 35.95 kg/m   Physical Exam  Constitutional: He is oriented to person, place, and time. He appears well-developed and well-nourished. No distress.  HENT:  Head: Normocephalic and atraumatic.  Right Ear: Tympanic membrane and ear canal normal.  Left Ear: Tympanic membrane and ear canal normal.  Mouth/Throat: Uvula is midline and mucous membranes are  normal. Posterior oropharyngeal erythema present. No oropharyngeal exudate, posterior oropharyngeal edema or tonsillar abscesses. Tonsils are 0 on the right. Tonsils are 0 on the left. No tonsillar exudate.  Neck: Normal range of motion, full passive range of motion without pain and phonation normal. Neck supple.  Cardiovascular: Normal rate, regular rhythm and normal heart sounds.   No murmur heard. Pulmonary/Chest:  Effort normal. No stridor. No respiratory distress. He has wheezes. He has no rales. He exhibits no tenderness.  Coarse lungs sounds bilaterally with expiratory wheezing throughout   Abdominal: Soft. He exhibits no distension. There is no tenderness. There is no guarding.  Musculoskeletal: Normal range of motion. He exhibits no edema.  Lymphadenopathy:    He has no cervical adenopathy.  Neurological: He is alert and oriented to person, place, and time. He exhibits normal muscle tone. Coordination normal.  Skin: Skin is warm and dry.  Nursing note and vitals reviewed.    ED Treatments / Results  Labs (all labs ordered are listed, but only abnormal results are displayed) Labs Reviewed - No data to display  EKG  EKG Interpretation  Date/Time:  Monday July 25 2016 21:41:38 EST Ventricular Rate:  89 PR Interval:    QRS Duration: 101 QT Interval:  356 QTC Calculation: 434 R Axis:   38 Text Interpretation:  Sinus rhythm Consider left atrial enlargement Low voltage, extremity and precordial leads No significant change was found Confirmed by Manus GunningANCOUR  MD, STEPHEN [phone]([id number]) on 07/26/2016 12:39:18 AM       Radiology Dg Chest 2 View  Result Date: 07/25/2016 CLINICAL DATA:  Acute onset of generalized chest pressure, shortness of breath, cough, fever and chills. Initial encounter. EXAM: CHEST  2 VIEW COMPARISON:  Chest radiograph performed 03/12/2013 FINDINGS: The lungs are well-aerated. Mild bibasilar atelectasis is noted. There is no evidence of pleural effusion or pneumothorax. The heart is normal in size; the mediastinal contour is within normal limits. No acute osseous abnormalities are seen. IMPRESSION: Mild bibasilar atelectasis noted.  Lungs otherwise grossly clear. Electronically Signed   By: Roanna RaiderJeffery  Chang M.D.   On: 07/25/2016 23:05    Procedures Procedures (including critical care time)  Medications Ordered in ED Medications  ipratropium-albuterol (DUONEB) 0.5-2.5 (3) MG/3ML  nebulizer solution 3 mL (3 mLs Nebulization Given 07/25/16 2358)  albuterol (PROVENTIL) (2.5 MG/3ML) 0.083% nebulizer solution 2.5 mg (2.5 mg Nebulization Given 07/25/16 2358)  predniSONE (DELTASONE) tablet 60 mg (60 mg Oral Given 07/25/16 2344)  chlorpheniramine-HYDROcodone (TUSSIONEX) 10-8 MG/5ML suspension 5 mL (5 mLs Oral Given 07/25/16 2344)  albuterol (PROVENTIL HFA;VENTOLIN HFA) 108 (90 Base) MCG/ACT inhaler 2 puff (2 puffs Inhalation Given 07/26/16 0221)     Initial Impression / Assessment and Plan / ED Course  I have reviewed the triage vital signs and the nursing notes.  Pertinent labs & imaging results that were available during my care of the patient were reviewed by me and considered in my medical decision making (see chart for details).     Pt non-toxic appearing.  Course lung sounds on exam with wheezing.  On recheck after albuterol neb and anti-tussive, pt is feeling much better and lung sounds improved.  He is tolerating po fluids.  No hypoxia, tachypnea or tachycardia.sx's likely related to bronchitis and bronchospasm.  Albuterol MDI dispensed, pt agrees to tx plan with abx, and anti-tussive.  Po steroid given here, but not prescribed due to pt's further hx of stated "pre-diabetes" and hx of elevated BS secondary to taking steroids in the past.  Strict return precautions given.    Final Clinical Impressions(s) / ED Diagnoses   Final diagnoses:  Bronchitis  Bronchospasm    New Prescriptions Discharge Medication List as of 07/26/2016  2:04 AM    START taking these medications   Details  azithromycin (ZITHROMAX) 250 MG tablet Take first 2 tablets together, then 1 every day until finished., Print    guaiFENesin-codeine (ROBITUSSIN AC) 100-10 MG/5ML syrup Take 10 mLs by mouth 3 (three) times daily as needed., Starting Tue 07/26/2016, Print         Michaeljames Milnes [place], PA-C 07/27/16 1335    Glynn Octave, MD 07/28/16 [phone]

## 2016-10-07 ENCOUNTER — Other Ambulatory Visit (HOSPITAL_COMMUNITY): Payer: Self-pay | Admitting: Family Medicine

## 2016-10-07 ENCOUNTER — Ambulatory Visit (HOSPITAL_COMMUNITY)
Admission: RE | Admit: 2016-10-07 | Discharge: 2016-10-07 | Disposition: A | Discharge status: Home / Self Care | Payer: BLUE CROSS/BLUE SHIELD | Source: Ambulatory Visit | Attending: Family Medicine | Admitting: Family Medicine

## 2016-10-07 DIAGNOSIS — M545 Low back pain: Secondary | ICD-10-CM | POA: Insufficient documentation

## 2016-10-07 DIAGNOSIS — R52 Pain, unspecified: Secondary | ICD-10-CM

## 2016-12-12 ENCOUNTER — Encounter (HOSPITAL_COMMUNITY): Payer: Self-pay | Admitting: Emergency Medicine

## 2016-12-12 ENCOUNTER — Emergency Department (HOSPITAL_COMMUNITY)
Admission: EM | Admit: 2016-12-12 | Discharge: 2016-12-13 | Disposition: A | Discharge status: Home / Self Care | Payer: BLUE CROSS/BLUE SHIELD | Attending: Emergency Medicine | Admitting: Emergency Medicine

## 2016-12-12 DIAGNOSIS — Z87891 Personal history of nicotine dependence: Secondary | ICD-10-CM | POA: Insufficient documentation

## 2016-12-12 DIAGNOSIS — M5136 Other intervertebral disc degeneration, lumbar region: Secondary | ICD-10-CM | POA: Diagnosis not present

## 2016-12-12 DIAGNOSIS — Y9389 Activity, other specified: Secondary | ICD-10-CM | POA: Diagnosis not present

## 2016-12-12 DIAGNOSIS — I10 Essential (primary) hypertension: Secondary | ICD-10-CM | POA: Insufficient documentation

## 2016-12-12 DIAGNOSIS — Y998 Other external cause status: Secondary | ICD-10-CM | POA: Diagnosis not present

## 2016-12-12 DIAGNOSIS — S39012A Strain of muscle, fascia and tendon of lower back, initial encounter: Secondary | ICD-10-CM | POA: Insufficient documentation

## 2016-12-12 DIAGNOSIS — S3992XA Unspecified injury of lower back, initial encounter: Secondary | ICD-10-CM | POA: Diagnosis present

## 2016-12-12 DIAGNOSIS — Y929 Unspecified place or not applicable: Secondary | ICD-10-CM | POA: Insufficient documentation

## 2016-12-12 DIAGNOSIS — X500XXA Overexertion from strenuous movement or load, initial encounter: Secondary | ICD-10-CM | POA: Diagnosis not present

## 2016-12-12 MED ORDER — HYDROCODONE-ACETAMINOPHEN 5-325 MG PO TABS
2.0000 | ORAL_TABLET | Freq: Once | ORAL | Status: AC
  Administered 2016-12-13: 2 via ORAL
  Filled 2016-12-12: qty 2

## 2016-12-12 MED ORDER — PROMETHAZINE HCL 12.5 MG PO TABS
12.5000 mg | ORAL_TABLET | Freq: Once | ORAL | Status: AC
  Administered 2016-12-13: 12.5 mg via ORAL
  Filled 2016-12-12: qty 1

## 2016-12-12 MED ORDER — DEXAMETHASONE SODIUM PHOSPHATE 4 MG/ML IJ SOLN
8.0000 mg | Freq: Once | INTRAMUSCULAR | Status: AC
  Administered 2016-12-13: 8 mg via INTRAMUSCULAR
  Filled 2016-12-12: qty 2

## 2016-12-12 MED ORDER — DIAZEPAM 5 MG PO TABS
10.0000 mg | ORAL_TABLET | Freq: Once | ORAL | Status: AC
  Administered 2016-12-13: 10 mg via ORAL
  Filled 2016-12-12: qty 2

## 2016-12-12 NOTE — ED Notes (Signed)
Pt says was shoveling some mulch this morning & now having back pain that running down his leg.

## 2016-12-12 NOTE — ED Triage Notes (Signed)
Pt c/o lower back pain that radiates down right leg.  

## 2016-12-13 MED ORDER — HYDROCODONE-ACETAMINOPHEN 5-325 MG PO TABS: 1.0000 | ORAL_TABLET | ORAL | 0 refills | Status: DC | PRN

## 2016-12-13 MED ORDER — DEXAMETHASONE 4 MG PO TABS: 4.0000 mg | ORAL_TABLET | Freq: Two times a day (BID) | ORAL | 0 refills | Status: DC

## 2016-12-13 MED ORDER — CYCLOBENZAPRINE HCL 10 MG PO TABS: 10.0000 mg | ORAL_TABLET | Freq: Three times a day (TID) | ORAL | 0 refills | Status: DC

## 2016-12-13 MED ORDER — DICLOFENAC SODIUM 75 MG PO TBEC: 75.0000 mg | DELAYED_RELEASE_TABLET | Freq: Two times a day (BID) | ORAL | 0 refills | Status: DC

## 2016-12-13 NOTE — ED Notes (Signed)
Pt alert & oriented x4, stable gait. Patient  given discharge instructions, paperwork & prescription(s). Patient verbalized understanding. Pt left department w/ no further questions. 

## 2016-12-13 NOTE — Discharge Instructions (Signed)
I reviewed the x-rays that you had done recently. There appears to be some spasm of your lower back. Your examination tonight also questions a spasm. Review of your MRI back from a few years back indicates degenerative disc disease. No gross neurologic deficits appreciated at this time. Please discuss your pain in your need to be seen in the emergency department with Dr. Sudie BaileyKnowlton. It may be time for another MRI of your back. Please use a heating pad to your back, or use warm tub soaks on. Please use the Flexeril Decadron and diclofenac daily.

## 2016-12-13 NOTE — ED Provider Notes (Signed)
[facility] Provider Note   CSN: [id number] Arrival date & time: 12/12/16  2302     History   Chief Complaint Chief Complaint  Patient presents with  . Back Pain    HPI LORD Corey [name] is a 36 y.o. male.  The patient is a 36 year old male who presents to the emergency department with complaint of lower back pain.  The patient states that he has had problems with his back for several years. He states that he is on usually able to take care of it on his own. On today he was moving some mulch on. He began to have a little tingle in his lower back. He took a nap to see if it would go away. When he awakened he states that he had to have help getting in and out of the bed and the pain is getting progressively worse since that time. He has not lost control of bowels or bladder. But he has pain that is increased when he changes positions and increase when he stands. No previous operations or procedures on the lower back.      Past Medical History:  Diagnosis Date  . Chronic back pain   . Hypertension   . Migraine     There are no active problems to display for this patient.   Past Surgical History:  Procedure Laterality Date  . WRIST SURGERY  right       Home Medications    Prior to Admission medications   Medication Sig Start Date End Date Taking? Authorizing Provider  benazepril-hydrochlorthiazide (LOTENSIN HCT) 20-25 MG tablet Take 1 tablet by mouth daily. 12/09/15  Yes Ivery Quale, PA-C  ibuprofen (ADVIL,MOTRIN) 200 MG tablet Take 200 mg by mouth every 6 (six) hours as needed for moderate pain.   Yes [provider]    Family History No family history on file.  Social History Social History  Substance Use Topics  . Smoking status: Former Smoker    Packs/day: 0.50    Types: Cigarettes  . Smokeless tobacco: Never Used  . Alcohol use No     Allergies   Morphine and related   Review of Systems Review of Systems  Constitutional: Negative  for activity change and appetite change.  HENT: Negative for congestion, ear discharge, ear pain, facial swelling, nosebleeds, rhinorrhea, sneezing and tinnitus.   Eyes: Negative for photophobia, pain and discharge.  Respiratory: Negative for cough, choking, shortness of breath and wheezing.   Cardiovascular: Negative for chest pain, palpitations and leg swelling.  Gastrointestinal: Negative for abdominal pain, blood in stool, constipation, diarrhea, nausea and vomiting.  Genitourinary: Negative for difficulty urinating, dysuria, flank pain, frequency and hematuria.  Musculoskeletal: Positive for back pain. Negative for gait problem, myalgias and neck pain.  Skin: Negative for color change, rash and wound.  Neurological: Negative for dizziness, seizures, syncope, facial asymmetry, speech difficulty, weakness and numbness.  Hematological: Negative for adenopathy. Does not bruise/bleed easily.  Psychiatric/Behavioral: Negative for agitation, confusion, hallucinations, self-injury and suicidal ideas. The patient is not nervous/anxious.      Physical Exam Updated Vital Signs BP (!) 150/112   Pulse 75   Temp 98.2 F (36.8 C)   Resp 20   Ht 6\' 2"  (1.88 m)   Wt 127 kg (280 lb)   SpO2 97%   BMI 35.95 kg/m   Physical Exam  Constitutional: Vital signs are normal. He appears well-developed and well-nourished. He is active.  HENT:  Head: Normocephalic and atraumatic.  Right Ear:  Tympanic membrane, external ear and ear canal normal.  Left Ear: Tympanic membrane, external ear and ear canal normal.  Nose: Nose normal.  Mouth/Throat: Uvula is midline, oropharynx is clear and moist and mucous membranes are normal.  Eyes: Conjunctivae, EOM and lids are normal. Pupils are equal, round, and reactive to light.  Neck: Trachea normal, normal range of motion and phonation normal. Neck supple. Carotid bruit is not present.  Cardiovascular: Normal rate, regular rhythm and normal pulses.   Abdominal:  Soft. Normal appearance and bowel sounds are normal.  Musculoskeletal: He exhibits tenderness.       Lumbar back: He exhibits decreased range of motion, pain and spasm.  Lymphadenopathy:       Head (right side): No submental, no preauricular and no posterior auricular adenopathy present.       Head (left side): No submental, no preauricular and no posterior auricular adenopathy present.    He has no cervical adenopathy.  Neurological: He is alert. He has normal strength. No cranial nerve deficit or sensory deficit. Coordination normal. GCS eye subscore is 4. GCS verbal subscore is 5. GCS motor subscore is 6.  Skin: Skin is warm and dry.  Psychiatric: His speech is normal.     ED Treatments / Results  Labs (all labs ordered are listed, but only abnormal results are displayed) Labs Reviewed - No data to display  EKG  EKG Interpretation None       Radiology No results found.  Procedures Procedures (including critical care time)  Medications Ordered in ED Medications  diazepam (VALIUM) tablet 10 mg (not administered)  dexamethasone (DECADRON) injection 8 mg (not administered)  HYDROcodone-acetaminophen (NORCO/VICODIN) 5-325 MG per tablet 2 tablet (not administered)  promethazine (PHENERGAN) tablet 12.5 mg (not administered)     Initial Impression / Assessment and Plan / ED Course  I have reviewed the triage vital signs and the nursing notes.  Pertinent labs & imaging results that were available during my care of the patient were reviewed by me and considered in my medical decision making (see chart for details).       Final Clinical Impressions(s) / ED Diagnoses MDM Patient has a history of degenerative disc disease from some years back. He has not had any treatment or follow-up evaluation read until recently. He had an x-ray done about 2-3 weeks ago. At that time he was noted to have some spasm of the lower back area. The examination tonight also suggest some spasm on.  The patient was treated in the emergency department with a muscle relaxer as well as an anti-inflammatory pain medication.  Recheck the patient seems to be feeling some better. The pain is not completely resolved but improved. I've asked patient to discuss his symptoms and increase in and changes in symptoms with Dr. Sudie BaileyKnowlton on. We discussed that it may be time to get another MRI and also to plan a strategy for these increasing pains as they are beginning to interfere with his activities of daily living. No gross neurologic deficit appreciated on examination at this time. The patient is in agreement with this plan.    Final diagnoses:  Strain of lumbar region, initial encounter  DDD (degenerative disc disease), lumbar    New Prescriptions New Prescriptions   CYCLOBENZAPRINE (FLEXERIL) 10 MG TABLET    Take 1 tablet (10 mg total) by mouth 3 (three) times daily.   DEXAMETHASONE (DECADRON) 4 MG TABLET    Take 1 tablet (4 mg total) by mouth 2 (two) times  daily with a meal.   DICLOFENAC (VOLTAREN) 75 MG EC TABLET    Take 1 tablet (75 mg total) by mouth 2 (two) times daily.   HYDROCODONE-ACETAMINOPHEN (NORCO/VICODIN) 5-325 MG TABLET    Take 1 tablet by mouth every 4 (four) hours as needed.     Ivery Quale, PA-C 12/13/16 0121    Dione Booze, MD 12/13/16 [phone]

## 2017-01-03 ENCOUNTER — Other Ambulatory Visit (HOSPITAL_COMMUNITY): Payer: Self-pay | Admitting: Family Medicine

## 2017-01-03 DIAGNOSIS — M5442 Lumbago with sciatica, left side: Secondary | ICD-10-CM

## 2017-01-06 ENCOUNTER — Ambulatory Visit (HOSPITAL_COMMUNITY): Payer: BLUE CROSS/BLUE SHIELD

## 2017-01-06 ENCOUNTER — Encounter (HOSPITAL_COMMUNITY): Payer: Self-pay

## 2017-03-24 DIAGNOSIS — R51 Headache: Secondary | ICD-10-CM | POA: Diagnosis present

## 2017-03-24 DIAGNOSIS — I10 Essential (primary) hypertension: Secondary | ICD-10-CM | POA: Insufficient documentation

## 2017-03-24 DIAGNOSIS — Z79899 Other long term (current) drug therapy: Secondary | ICD-10-CM | POA: Diagnosis not present

## 2017-03-24 DIAGNOSIS — G43009 Migraine without aura, not intractable, without status migrainosus: Secondary | ICD-10-CM | POA: Diagnosis not present

## 2017-03-24 DIAGNOSIS — Z87891 Personal history of nicotine dependence: Secondary | ICD-10-CM | POA: Insufficient documentation

## 2017-03-25 ENCOUNTER — Emergency Department (HOSPITAL_COMMUNITY)
Admission: EM | Admit: 2017-03-25 | Discharge: 2017-03-25 | Disposition: A | Discharge status: Home / Self Care | Payer: BLUE CROSS/BLUE SHIELD | Attending: Emergency Medicine | Admitting: Emergency Medicine

## 2017-03-25 ENCOUNTER — Encounter (HOSPITAL_COMMUNITY): Payer: Self-pay

## 2017-03-25 DIAGNOSIS — G43009 Migraine without aura, not intractable, without status migrainosus: Secondary | ICD-10-CM

## 2017-03-25 MED ORDER — METOCLOPRAMIDE HCL 5 MG/ML IJ SOLN
10.0000 mg | Freq: Once | INTRAMUSCULAR | Status: AC
  Administered 2017-03-25: 10 mg via INTRAVENOUS
  Filled 2017-03-25: qty 2

## 2017-03-25 MED ORDER — SODIUM CHLORIDE 0.9 % IV BOLUS (SEPSIS)
1000.0000 mL | Freq: Once | INTRAVENOUS | Status: AC
  Administered 2017-03-25: 1000 mL via INTRAVENOUS

## 2017-03-25 MED ORDER — DIPHENHYDRAMINE HCL 50 MG/ML IJ SOLN
25.0000 mg | Freq: Once | INTRAMUSCULAR | Status: AC
  Administered 2017-03-25: 25 mg via INTRAVENOUS
  Filled 2017-03-25: qty 1

## 2017-03-25 MED ORDER — METOCLOPRAMIDE HCL 10 MG PO TABS: 10.0000 mg | ORAL_TABLET | Freq: Four times a day (QID) | ORAL | 0 refills | Status: DC | PRN

## 2017-03-25 MED ORDER — KETOROLAC TROMETHAMINE 30 MG/ML IJ SOLN
30.0000 mg | Freq: Once | INTRAMUSCULAR | Status: AC
  Administered 2017-03-25: 30 mg via INTRAVENOUS
  Filled 2017-03-25: qty 1

## 2017-03-25 MED ORDER — DEXAMETHASONE SODIUM PHOSPHATE 10 MG/ML IJ SOLN
10.0000 mg | Freq: Once | INTRAMUSCULAR | Status: AC
  Administered 2017-03-25: 10 mg via INTRAVENOUS
  Filled 2017-03-25: qty 1

## 2017-03-25 NOTE — ED Provider Notes (Signed)
[facility]-EMERGENCY DEPT Provider Note   CSN: [id number] Arrival date & time: 03/24/17  2358     History   Chief Complaint Chief Complaint  Patient presents with  . Headache    HPI Corey [name] is a 36 y.o. male.  The history is provided by the patient.  He has a history of migraine headaches. He had onset today of severe facial pain typical of his migraines. Pain is dull and throbbing he rates it 8/10. It is worse with exposure to light or noise. Nothing makes it better. There is associated nausea but no vomiting. He denies fever or chills. He denies stiff neck.  Past Medical History:  Diagnosis Date  . Chronic back pain   . Hypertension   . Migraine     There are no active problems to display for this patient.   Past Surgical History:  Procedure Laterality Date  . WRIST SURGERY  right       Home Medications    Prior to Admission medications   Medication Sig Start Date End Date Taking? Authorizing Provider  benazepril-hydrochlorthiazide (LOTENSIN HCT) 20-25 MG tablet Take 1 tablet by mouth daily. 12/09/15  Yes Ivery Quale, PA-C  cyclobenzaprine (FLEXERIL) 10 MG tablet Take 1 tablet (10 mg total) by mouth 3 (three) times daily. 12/13/16  Yes Ivery Quale, PA-C  dexamethasone (DECADRON) 4 MG tablet Take 1 tablet (4 mg total) by mouth 2 (two) times daily with a meal. 12/13/16  Yes Ivery Quale, PA-C  ibuprofen (ADVIL,MOTRIN) 200 MG tablet Take 200 mg by mouth every 6 (six) hours as needed for moderate pain.   Yes [provider]  diclofenac (VOLTAREN) 75 MG EC tablet Take 1 tablet (75 mg total) by mouth 2 (two) times daily. 12/13/16   Ivery Quale, PA-C  HYDROcodone-acetaminophen (NORCO/VICODIN) 5-325 MG tablet Take 1 tablet by mouth every 4 (four) hours as needed. 12/13/16   Ivery Quale, PA-C    Family History No family history on file.  Social History Social History  Substance Use Topics  . Smoking status: Former Smoker    Packs/day: 0.50   Types: Cigarettes  . Smokeless tobacco: Never Used  . Alcohol use No     Allergies   Morphine and related   Review of Systems Review of Systems  All other systems reviewed and are negative.    Physical Exam Updated Vital Signs BP (!) 150/100 (BP Location: Right Arm)   Pulse 85   Temp 97.8 F (36.6 C) (Oral)   Resp (!) 22   Ht  (1.88 m)   Wt 127 kg (280 lb)   SpO2 98%   BMI 35.95 kg/m   Physical Exam  Nursing note and vitals reviewed.  36 year old male, who appears uncomfortable, but is in no acute distress. Vital signs are significant for hypertension and tachypnea. Oxygen saturation is 98%, which is normal. Head is normocephalic and atraumatic. PERRLA, EOMI. Oropharynx is clear. Neck is nontender and supple without adenopathy or JVD. Back is nontender and there is no CVA tenderness. Lungs are clear without rales, wheezes, or rhonchi. Chest is nontender. Heart has regular rate and rhythm without murmur. Abdomen is soft, flat, nontender without masses or hepatosplenomegaly and peristalsis is normoactive. Extremities have no cyanosis or edema, full range of motion is present. Skin is warm and dry without rash. Neurologic: Mental status is normal, cranial nerves are intact, there are no motor or sensory deficits.  ED Treatments / Results   Procedures Procedures (including  critical care time)  Medications Ordered in ED Medications  sodium chloride 0.9 % bolus 1,000 mL (0 mLs Intravenous Stopped 03/25/17 0510)  ketorolac (TORADOL) 30 MG/ML injection 30 mg (30 mg Intravenous Given 03/25/17 0354)  dexamethasone (DECADRON) injection 10 mg (10 mg Intravenous Given 03/25/17 0354)  metoCLOPramide (REGLAN) injection 10 mg (10 mg Intravenous Given 03/25/17 0354)  diphenhydrAMINE (BENADRYL) injection 25 mg (25 mg Intravenous Given 03/25/17 0354)     Initial Impression / Assessment and Plan / ED Course  I have reviewed the triage vital signs and the nursing  notes.  Pertinent labs & imaging results that were available during my care of the patient were reviewed by me and considered in my medical decision making (see chart for details).  Headache consistent with migraine. No advice to suggest more serious causes of headache. Old records are reviewed, and he has several prior ED visits for migraines. He is given a migraine cocktail of normal saline, metoclopramide, diphenhydramine, dexamethasone, and ketorolac.  He feels much better after above noted treatment. Headache is gone. He is discharged with prescription for metoclopramide and told to use over-the-counter NSAIDs 2 supplemented, also use over-the-counter acetaminophen as needed. Follow-up with PCP. Return precautions discussed.  Final Clinical Impressions(s) / ED Diagnoses   Final diagnoses:  Migraine without aura and without status migrainosus, not intractable    New Prescriptions New Prescriptions   METOCLOPRAMIDE (REGLAN) 10 MG TABLET    Take 1 tablet (10 mg total) by mouth every 6 (six) hours as needed for nausea (or headache).     Dione Booze, MD 03/25/17 [phone]

## 2017-03-25 NOTE — ED Triage Notes (Signed)
Pt reports cluster/migraine headache onset this am with nausea and vomiting.

## 2017-03-25 NOTE — ED Notes (Signed)
ED Provider at bedside. 

## 2017-03-25 NOTE — Discharge Instructions (Signed)
Take ibuprofen or naproxen as needed for pain. Take acetaminophen as needed for pain.

## 2017-11-10 ENCOUNTER — Other Ambulatory Visit: Payer: Self-pay

## 2017-11-10 ENCOUNTER — Encounter (HOSPITAL_COMMUNITY): Payer: Self-pay | Admitting: *Deleted

## 2017-11-10 ENCOUNTER — Emergency Department (HOSPITAL_COMMUNITY)
Admission: EM | Admit: 2017-11-10 | Discharge: 2017-11-10 | Disposition: A | Discharge status: Home / Self Care | Payer: BLUE CROSS/BLUE SHIELD | Attending: Emergency Medicine | Admitting: Emergency Medicine

## 2017-11-10 DIAGNOSIS — Y998 Other external cause status: Secondary | ICD-10-CM | POA: Insufficient documentation

## 2017-11-10 DIAGNOSIS — S40861A Insect bite (nonvenomous) of right upper arm, initial encounter: Secondary | ICD-10-CM | POA: Insufficient documentation

## 2017-11-10 DIAGNOSIS — Z87891 Personal history of nicotine dependence: Secondary | ICD-10-CM | POA: Insufficient documentation

## 2017-11-10 DIAGNOSIS — S50861A Insect bite (nonvenomous) of right forearm, initial encounter: Secondary | ICD-10-CM | POA: Insufficient documentation

## 2017-11-10 DIAGNOSIS — E1165 Type 2 diabetes mellitus with hyperglycemia: Secondary | ICD-10-CM | POA: Insufficient documentation

## 2017-11-10 DIAGNOSIS — W57XXXA Bitten or stung by nonvenomous insect and other nonvenomous arthropods, initial encounter: Secondary | ICD-10-CM | POA: Insufficient documentation

## 2017-11-10 DIAGNOSIS — L089 Local infection of the skin and subcutaneous tissue, unspecified: Secondary | ICD-10-CM | POA: Insufficient documentation

## 2017-11-10 DIAGNOSIS — E1169 Type 2 diabetes mellitus with other specified complication: Secondary | ICD-10-CM

## 2017-11-10 DIAGNOSIS — Y929 Unspecified place or not applicable: Secondary | ICD-10-CM | POA: Insufficient documentation

## 2017-11-10 DIAGNOSIS — Y9389 Activity, other specified: Secondary | ICD-10-CM | POA: Insufficient documentation

## 2017-11-10 DIAGNOSIS — R739 Hyperglycemia, unspecified: Secondary | ICD-10-CM

## 2017-11-10 DIAGNOSIS — I10 Essential (primary) hypertension: Secondary | ICD-10-CM | POA: Insufficient documentation

## 2017-11-10 LAB — CBC WITH DIFFERENTIAL/PLATELET
Basophils Absolute: 0 10*3/uL (ref 0.0–0.1)
Basophils Relative: 0 %
EOS ABS: 0.2 10*3/uL (ref 0.0–0.7)
EOS PCT: 2 %
HCT: 47.1 % (ref 39.0–52.0)
Hemoglobin: 15.8 g/dL (ref 13.0–17.0)
LYMPHS ABS: 2.6 10*3/uL (ref 0.7–4.0)
Lymphocytes Relative: 35 %
MCH: 29.2 pg (ref 26.0–34.0)
MCHC: 33.5 g/dL (ref 30.0–36.0)
MCV: 86.9 fL (ref 78.0–100.0)
MONO ABS: 0.4 10*3/uL (ref 0.1–1.0)
Monocytes Relative: 6 %
Neutro Abs: 4.2 10*3/uL (ref 1.7–7.7)
Neutrophils Relative %: 57 %
PLATELETS: 289 10*3/uL (ref 150–400)
RBC: 5.42 MIL/uL (ref 4.22–5.81)
RDW: 12.8 % (ref 11.5–15.5)
WBC: 7.4 10*3/uL (ref 4.0–10.5)

## 2017-11-10 LAB — COMPREHENSIVE METABOLIC PANEL
ALT: 44 U/L (ref 17–63)
ANION GAP: 8 (ref 5–15)
AST: 31 U/L (ref 15–41)
Albumin: 4.1 g/dL (ref 3.5–5.0)
Alkaline Phosphatase: 96 U/L (ref 38–126)
BUN: 14 mg/dL (ref 6–20)
CALCIUM: 9.3 mg/dL (ref 8.9–10.3)
CO2: 24 mmol/L (ref 22–32)
Chloride: 100 mmol/L — ABNORMAL LOW (ref 101–111)
Creatinine, Ser: 1.01 mg/dL (ref 0.61–1.24)
GFR calc non Af Amer: >60 mL/min (ref >60)
Glucose, Bld: 420 mg/dL — ABNORMAL HIGH (ref 65–99)
Potassium: 4.1 mmol/L (ref 3.5–5.1)
SODIUM: 132 mmol/L — AB (ref 135–145)
TOTAL PROTEIN: 7.3 g/dL (ref 6.5–8.1)
Total Bilirubin: 0.8 mg/dL (ref 0.3–1.2)

## 2017-11-10 LAB — CBG MONITORING, ED: Glucose-Capillary: 312 mg/dL — ABNORMAL HIGH (ref 65–99)

## 2017-11-10 MED ORDER — ONDANSETRON HCL 4 MG/2ML IJ SOLN
4.0000 mg | Freq: Once | INTRAMUSCULAR | Status: AC
  Administered 2017-11-10: 4 mg via INTRAVENOUS
  Filled 2017-11-10: qty 2

## 2017-11-10 MED ORDER — HYDROCODONE-ACETAMINOPHEN 5-325 MG PO TABS: 1.0000 | ORAL_TABLET | ORAL | 0 refills | Status: DC | PRN

## 2017-11-10 MED ORDER — METFORMIN HCL 500 MG PO TABS
500.0000 mg | ORAL_TABLET | Freq: Once | ORAL | Status: AC
  Administered 2017-11-10: 500 mg via ORAL
  Filled 2017-11-10: qty 1

## 2017-11-10 MED ORDER — KETOROLAC TROMETHAMINE 30 MG/ML IJ SOLN
30.0000 mg | Freq: Once | INTRAMUSCULAR | Status: AC
  Administered 2017-11-10: 30 mg via INTRAVENOUS
  Filled 2017-11-10: qty 1

## 2017-11-10 MED ORDER — SULFAMETHOXAZOLE-TRIMETHOPRIM 800-160 MG PO TABS: 1.0000 | ORAL_TABLET | Freq: Two times a day (BID) | ORAL | 0 refills | Status: AC

## 2017-11-10 MED ORDER — SULFAMETHOXAZOLE-TRIMETHOPRIM 800-160 MG PO TABS
1.0000 | ORAL_TABLET | Freq: Once | ORAL | Status: AC
  Administered 2017-11-10: 1 via ORAL
  Filled 2017-11-10: qty 1

## 2017-11-10 MED ORDER — SODIUM CHLORIDE 0.9 % IV BOLUS
1000.0000 mL | Freq: Once | INTRAVENOUS | Status: AC
  Administered 2017-11-10: 1000 mL via INTRAVENOUS

## 2017-11-10 MED ORDER — METFORMIN HCL 500 MG PO TABS: 500.0000 mg | ORAL_TABLET | Freq: Two times a day (BID) | ORAL | 0 refills | Status: DC

## 2017-11-10 NOTE — ED Notes (Signed)
Raised rash to right trunk and rash to right forearm.  Rates pain 8/10.  Leona Singleton, PA informed.

## 2017-11-10 NOTE — ED Triage Notes (Addendum)
Pt c/o red rash to right arm and right side of abdomen that came up 2 days ago. Pt reports he didn't see any spiders or bugs but he is concerned he has possible spider bites. Pt reports he has taken Benadryl at home for the itching. Pt c/o "pins and needles" feeling over the rash.

## 2017-11-10 NOTE — ED Provider Notes (Signed)
[name] EMERGENCY DEPARTMENT Provider Note   CSN: [id number] Arrival date & time: 11/10/17  1138     History   Chief Complaint Chief Complaint  Patient presents with  . Rash    HPI Corey [name] is a 37 y.o. male with a history of htn, borderline DM and chronic back pain presenting with erythema and pain along his right flank and also his right arm which he woke with 2 days ago.  Additionally describes "just not feeling well" with mild nausea and fatigue since this rash began. He denies fevers, chills, vomiting, dysuria, abdominal pain, cp, sob.  The site on his right flank is painful, describing needle like stabs and the areas on his right arm are also painful but additionally are itchy.  He speculates insect bites but denies having any problems with insects in his home. He took benadryl this morning which helped with the itching.   The history is provided by the patient.    Past Medical History:  Diagnosis Date  . Chronic back pain   . Hypertension   . Migraine     There are no active problems to display for this patient.   Past Surgical History:  Procedure Laterality Date  . WRIST SURGERY  right        Home Medications    Prior to Admission medications   Medication Sig Start Date End Date Taking? Authorizing Provider  diphenhydrAMINE (BENADRYL) 25 MG tablet Take 25 mg by mouth every 6 (six) hours as needed for itching.   Yes [provider]  HYDROcodone-acetaminophen (NORCO/VICODIN) 5-325 MG tablet Take 1 tablet by mouth every 4 (four) hours as needed. 11/10/17   Burgess Amor, PA-C  metFORMIN (GLUCOPHAGE) 500 MG tablet Take 1 tablet (500 mg total) by mouth 2 (two) times daily with a meal. 11/10/17   Roberts Bon, Raynelle Fanning, PA-C  sulfamethoxazole-trimethoprim (BACTRIM DS,SEPTRA DS) 800-160 MG tablet Take 1 tablet by mouth 2 (two) times daily for 10 days. 11/10/17 11/20/17  Burgess Amor, PA-C    Family History No family history on file.  Social History Social  History   Tobacco Use  . Smoking status: Former Smoker    Packs/day: 0.50    Types: Cigarettes  . Smokeless tobacco: Never Used  Substance Use Topics  . Alcohol use: No  . Drug use: No     Allergies   Morphine and related   Review of Systems Review of Systems  Constitutional: Positive for fatigue. Negative for fever.  HENT: Negative for congestion and sore throat.   Eyes: Negative.   Respiratory: Negative for chest tightness and shortness of breath.   Cardiovascular: Negative for chest pain.  Gastrointestinal: Positive for nausea. Negative for abdominal pain and vomiting.  Genitourinary: Negative.   Musculoskeletal: Negative for arthralgias, joint swelling and neck pain.  Skin: Positive for rash. Negative for wound.  Neurological: Negative for dizziness, weakness, light-headedness, numbness and headaches.  Psychiatric/Behavioral: Negative.      Physical Exam Updated Vital Signs BP (!) 157/108 (BP Location: Right Arm)   Pulse 62   Temp 98.5 F (36.9 C) (Oral)   Resp 16   Ht  (1.88 m)   Wt 129.3 kg (285 lb)   SpO2 98%   BMI 36.59 kg/m   Physical Exam  Constitutional: He appears well-developed and well-nourished.  HENT:  Head: Normocephalic and atraumatic.  Eyes: Conjunctivae are normal.  Neck: Normal range of motion.  Cardiovascular: Regular rhythm and normal heart sounds. Tachycardia present.  Pulmonary/Chest:  Effort normal and breath sounds normal. He has no wheezes.  Abdominal: Soft. Bowel sounds are normal. There is no tenderness.  Musculoskeletal: Normal range of motion.  Neurological: He is alert.  Skin: Skin is warm and dry.  2 distinct areas of slightly indurated erythematous circumferential lesions, one on the right proximal dorsal forearm and the other posterior upper arm.  There is an erythematous patch right lateral chest wall without induration. All areas tender to palpation.  Psychiatric: He has a normal mood and affect.  Nursing note and  vitals reviewed.    ED Treatments / Results  Labs (all labs ordered are listed, but only abnormal results are displayed) Labs Reviewed  COMPREHENSIVE METABOLIC PANEL - Abnormal; Notable for the following components:      Result Value   Sodium 132 (*)    Chloride 100 (*)    Glucose, Bld 420 (*)    All other components within normal limits  CBG MONITORING, ED - Abnormal; Notable for the following components:   Glucose-Capillary 312 (*)    All other components within normal limits  CBC WITH DIFFERENTIAL/PLATELET    EKG None  Radiology No results found.  Procedures Procedures (including critical care time)  Medications Ordered in ED Medications  metFORMIN (GLUCOPHAGE) tablet 500 mg (has no administration in time range)  sodium chloride 0.9 % bolus 1,000 mL (0 mLs Intravenous Stopped 11/10/17 1545)  ondansetron (ZOFRAN) injection 4 mg (4 mg Intravenous Given 11/10/17 1450)  ketorolac (TORADOL) 30 MG/ML injection 30 mg (30 mg Intravenous Given 11/10/17 1451)  sulfamethoxazole-trimethoprim (BACTRIM DS,SEPTRA DS) 800-160 MG per tablet 1 tablet (1 tablet Oral Given 11/10/17 1450)     Initial Impression / Assessment and Plan / ED Course  I have reviewed the triage vital signs and the nursing notes.  Pertinent labs & imaging results that were available during my care of the patient were reviewed by me and considered in my medical decision making (see chart for details).     Patient was given IV fluids, 1 L after which his CBG was improved.  He was also given a dose of Toradol through his IV line with some improvement in his pain.  He was started on Bactrim for the suspected infected insect bites.  Metformin also started.  He was given information about diabetes and asked to follow-up with his PCP next week for a recheck of his blood glucose, further management of this new condition and reevaluation of his rash if it persists.  He was advised following here over the weekend for any  worsening or new symptoms.  Final Clinical Impressions(s) / ED Diagnoses   Final diagnoses:  Hyperglycemia  Bug bite with infection, initial encounter  Type 2 diabetes mellitus with other specified complication, without long-term current use of insulin [facility])    ED Discharge Orders        Ordered    metFORMIN (GLUCOPHAGE) 500 MG tablet  2 times daily with meals     11/10/17 1601    sulfamethoxazole-trimethoprim (BACTRIM DS,SEPTRA DS) 800-160 MG tablet  2 times daily     11/10/17 1601    HYDROcodone-acetaminophen (NORCO/VICODIN) 5-325 MG tablet  Every 4 hours PRN     11/10/17 1601       Burgess Amor, PA-C 11/10/17 1610    Bethann Berkshire, MD 11/13/17 [phone]

## 2017-11-10 NOTE — Discharge Instructions (Signed)
Take the entire course of the antibiotic for your rash which I suspect may be infected insect bites.  Start taking the metformin as prescribed, you received a dose here so do not take your next dose until tomorrow morning with breakfast.  You may take the hydrocodone prescribed for pain relief.  This will make you drowsy - do not drive within 4 hours of taking this medication.  Call your doctor for a recheck this next week. Return here this weekend for any worsening problems or concerns.

## 2018-01-22 ENCOUNTER — Encounter (HOSPITAL_COMMUNITY): Payer: Self-pay | Admitting: *Deleted

## 2018-01-22 ENCOUNTER — Emergency Department (HOSPITAL_COMMUNITY)
Admission: EM | Admit: 2018-01-22 | Discharge: 2018-01-22 | Disposition: A | Discharge status: Home / Self Care | Payer: Self-pay | Attending: Emergency Medicine | Admitting: Emergency Medicine

## 2018-01-22 ENCOUNTER — Other Ambulatory Visit: Payer: Self-pay

## 2018-01-22 DIAGNOSIS — G43909 Migraine, unspecified, not intractable, without status migrainosus: Secondary | ICD-10-CM | POA: Insufficient documentation

## 2018-01-22 DIAGNOSIS — R739 Hyperglycemia, unspecified: Secondary | ICD-10-CM | POA: Insufficient documentation

## 2018-01-22 DIAGNOSIS — Z79899 Other long term (current) drug therapy: Secondary | ICD-10-CM | POA: Insufficient documentation

## 2018-01-22 DIAGNOSIS — I10 Essential (primary) hypertension: Secondary | ICD-10-CM | POA: Insufficient documentation

## 2018-01-22 DIAGNOSIS — Z87891 Personal history of nicotine dependence: Secondary | ICD-10-CM | POA: Insufficient documentation

## 2018-01-22 LAB — URINALYSIS, ROUTINE W REFLEX MICROSCOPIC
BACTERIA UA: NONE SEEN
BILIRUBIN URINE: NEGATIVE
Glucose, UA: >=500 mg/dL — AB
Hgb urine dipstick: NEGATIVE
Ketones, ur: 20 mg/dL — AB
Leukocytes, UA: NEGATIVE
NITRITE: NEGATIVE
Protein, ur: NEGATIVE mg/dL
SPECIFIC GRAVITY, URINE: 1.031 — AB (ref 1.005–1.030)
pH: 5 (ref 5.0–8.0)

## 2018-01-22 LAB — CBC WITH DIFFERENTIAL/PLATELET
BASOS PCT: 1 %
Basophils Absolute: 0.1 10*3/uL (ref 0.0–0.1)
EOS ABS: 0.1 10*3/uL (ref 0.0–0.7)
Eosinophils Relative: 1 %
HEMATOCRIT: 45.3 % (ref 39.0–52.0)
Hemoglobin: 16.1 g/dL (ref 13.0–17.0)
LYMPHS ABS: 2.3 10*3/uL (ref 0.7–4.0)
Lymphocytes Relative: 38 %
MCH: 30.2 pg (ref 26.0–34.0)
MCHC: 35.5 g/dL (ref 30.0–36.0)
MCV: 85 fL (ref 78.0–100.0)
MONO ABS: 0.4 10*3/uL (ref 0.1–1.0)
MONOS PCT: 7 %
NEUTROS ABS: 3.2 10*3/uL (ref 1.7–7.7)
Neutrophils Relative %: 53 %
Platelets: 279 10*3/uL (ref 150–400)
RBC: 5.33 MIL/uL (ref 4.22–5.81)
RDW: 12.6 % (ref 11.5–15.5)
WBC: 6 10*3/uL (ref 4.0–10.5)

## 2018-01-22 LAB — BASIC METABOLIC PANEL
Anion gap: 10 (ref 5–15)
BUN: 16 mg/dL (ref 6–20)
CALCIUM: 9 mg/dL (ref 8.9–10.3)
CO2: 24 mmol/L (ref 22–32)
CREATININE: 1.05 mg/dL (ref 0.61–1.24)
Chloride: 97 mmol/L — ABNORMAL LOW (ref 98–111)
GFR calc Af Amer: >60 mL/min (ref >60)
GFR calc non Af Amer: >60 mL/min (ref >60)
Glucose, Bld: 459 mg/dL — ABNORMAL HIGH (ref 70–99)
Potassium: 3.9 mmol/L (ref 3.5–5.1)
Sodium: 131 mmol/L — ABNORMAL LOW (ref 135–145)

## 2018-01-22 LAB — CBG MONITORING, ED: Glucose-Capillary: 488 mg/dL — ABNORMAL HIGH (ref 70–99)

## 2018-01-22 MED ORDER — METFORMIN HCL 500 MG PO TABS: 500.0000 mg | ORAL_TABLET | Freq: Two times a day (BID) | ORAL | 1 refills | Status: AC

## 2018-01-22 MED ORDER — VALPROATE SODIUM 500 MG/5ML IV SOLN
500.0000 mg | Freq: Once | INTRAVENOUS | Status: AC
  Administered 2018-01-22: 500 mg via INTRAVENOUS
  Filled 2018-01-22: qty 5

## 2018-01-22 MED ORDER — SUMATRIPTAN SUCCINATE 6 MG/0.5ML ~~LOC~~ SOLN
6.0000 mg | Freq: Once | SUBCUTANEOUS | Status: AC
  Administered 2018-01-22: 6 mg via SUBCUTANEOUS
  Filled 2018-01-22: qty 0.5

## 2018-01-22 MED ORDER — METFORMIN HCL 500 MG PO TABS: 500.0000 mg | ORAL_TABLET | Freq: Two times a day (BID) | ORAL | 1 refills | Status: DC

## 2018-01-22 MED ORDER — BENAZEPRIL-HYDROCHLOROTHIAZIDE 20-25 MG PO TABS: 1.0000 | ORAL_TABLET | Freq: Every day | ORAL | 1 refills | Status: DC

## 2018-01-22 MED ORDER — SODIUM CHLORIDE 0.9 % IV BOLUS
1000.0000 mL | Freq: Once | INTRAVENOUS | Status: AC
  Administered 2018-01-22: 1000 mL via INTRAVENOUS

## 2018-01-22 MED ORDER — SUMATRIPTAN SUCCINATE 25 MG PO TABS: 25.0000 mg | ORAL_TABLET | ORAL | 0 refills | Status: DC | PRN

## 2018-01-22 MED ORDER — SUMATRIPTAN SUCCINATE 25 MG PO TABS: 25.0000 mg | ORAL_TABLET | ORAL | 0 refills | Status: AC | PRN

## 2018-01-22 MED ORDER — ACETAMINOPHEN 325 MG PO TABS
650.0000 mg | ORAL_TABLET | Freq: Once | ORAL | Status: AC
Start: 2018-01-22 — End: 2018-01-22
  Administered 2018-01-22: 650 mg via ORAL
  Filled 2018-01-22: qty 2

## 2018-01-22 MED ORDER — KETOROLAC TROMETHAMINE 30 MG/ML IJ SOLN
15.0000 mg | Freq: Once | INTRAMUSCULAR | Status: AC
  Administered 2018-01-22: 15 mg via INTRAVENOUS
  Filled 2018-01-22: qty 1

## 2018-01-22 NOTE — Discharge Instructions (Addendum)
It is very important to follow-up with your primary care physician.  If you develop a worsening headache, or if you develop vomiting, fever, neck stiffness, weakness or numbness in your arms or legs, or other new/concerning symptoms and return to the ER for evaluation.  Otherwise follow-up with your primary care physician in about 1 or 2 weeks.

## 2018-01-22 NOTE — ED Provider Notes (Signed)
[facility] EMERGENCY DEPARTMENT Provider Note   CSN: [id number] Arrival date & time: 01/22/18  1914     History   Chief Complaint Chief Complaint  Patient presents with  . Migraine    HPI Corey [name] is a 37 y.o. male.  HPI  37 year old male with a history of migraines presents with recurrent headache.  Started 2 days ago but worse this morning upon waking.  Headache feels like it is behind his eyes and goes to the occiput.  Similar to prior headaches.  Patient has denied fevers or vomiting.  He is having photophobia and phonophobia.  He is also been having blurry vision for a while, he clarifies this is weeks or month.  He is also had increased urination.  He was started on metformin a couple months ago when in the ER but states he has run out of this.  He also has run out of his blood pressure medicine for his chronic hypertension.  He has not been having any chest pain, shortness of breath, or unilateral weakness. Headache is rated as a 10.  He is requesting not to be given the typical medication given for migraines because he states this causes him to feel like he is on fire.  He does not know what this medicine is but states it is almost always given first.  Past Medical History:  Diagnosis Date  . Chronic back pain   . Hypertension   . Migraine     There are no active problems to display for this patient.   Past Surgical History:  Procedure Laterality Date  . WRIST SURGERY  right        Home Medications    Prior to Admission medications   Medication Sig Start Date End Date Taking? Authorizing Provider  benazepril-hydrochlorthiazide (LOTENSIN HCT) 20-25 MG tablet Take 1 tablet by mouth daily. 01/22/18   Pricilla Loveless, MD  diphenhydrAMINE (BENADRYL) 25 MG tablet Take 25 mg by mouth every 6 (six) hours as needed for itching.    [provider]  HYDROcodone-acetaminophen (NORCO/VICODIN) 5-325 MG tablet Take 1 tablet by mouth every 4 (four) hours as  needed. 11/10/17   Burgess Amor, PA-C  metFORMIN (GLUCOPHAGE) 500 MG tablet Take 1 tablet (500 mg total) by mouth 2 (two) times daily with a meal. 01/22/18   Pricilla Loveless, MD  SUMAtriptan (IMITREX) 25 MG tablet Take 1 tablet (25 mg total) by mouth every 2 (two) hours as needed for migraine. May repeat in 2 hours if headache persists or recurs. 01/22/18   Pricilla Loveless, MD    Family History History reviewed. No pertinent family history.  Social History Social History   Tobacco Use  . Smoking status: Former Smoker    Packs/day: 0.50    Types: Cigarettes  . Smokeless tobacco: Never Used  Substance Use Topics  . Alcohol use: No  . Drug use: No     Allergies   Morphine and related   Review of Systems Review of Systems  Constitutional: Negative for fever.  Eyes: Positive for photophobia and visual disturbance.  Respiratory: Negative for shortness of breath.   Cardiovascular: Negative for chest pain.  Gastrointestinal: Negative for nausea and vomiting.  Endocrine: Positive for polydipsia and polyuria.  Neurological: Positive for headaches.  All other systems reviewed and are negative.    Physical Exam Updated Vital Signs BP (!) 154/98 (BP Location: Left Arm)   Pulse (!) 51   Temp 97.8 F (36.6 C) (Oral)   Ht  6\' 2"  (1.88 m)   Wt 117.9 kg (260 lb)   SpO2 98%   BMI 33.38 kg/m   Physical Exam  Constitutional: He is oriented to person, place, and time. He appears well-developed and well-nourished. No distress.  HENT:  Head: Normocephalic and atraumatic.  Right Ear: External ear normal.  Left Ear: External ear normal.  Nose: Nose normal.  Eyes: Pupils are equal, round, and reactive to light. EOM are normal. Right eye exhibits no discharge. Left eye exhibits no discharge.  Mild photophobia  Neck: Normal range of motion. Neck supple. No neck rigidity. Normal range of motion present.  Cardiovascular: Normal rate, regular rhythm and normal heart sounds.  Pulmonary/Chest:  Effort normal and breath sounds normal.  Abdominal: Soft. There is no tenderness.  Musculoskeletal: He exhibits no edema.  Neurological: He is alert and oriented to person, place, and time.  CN 3-12 grossly intact. 5/5 strength in all 4 extremities. Grossly normal sensation. Normal finger to nose.   Skin: Skin is warm and dry. He is not diaphoretic.  Nursing note and vitals reviewed.    ED Treatments / Results  Labs (all labs ordered are listed, but only abnormal results are displayed) Labs Reviewed  BASIC METABOLIC PANEL - Abnormal; Notable for the following components:      Result Value   Sodium 131 (*)    Chloride 97 (*)    Glucose, Bld 459 (*)    All other components within normal limits  URINALYSIS, ROUTINE W REFLEX MICROSCOPIC - Abnormal; Notable for the following components:   Color, Urine STRAW (*)    Specific Gravity, Urine 1.031 (*)    Glucose, UA >=500 (*)    Ketones, ur 20 (*)    All other components within normal limits  CBG MONITORING, ED - Abnormal; Notable for the following components:   Glucose-Capillary 488 (*)    All other components within normal limits  CBC WITH DIFFERENTIAL/PLATELET    EKG None  Radiology No results found.  Procedures Procedures (including critical care time)  Medications Ordered in ED Medications  sodium chloride 0.9 % bolus 1,000 mL (0 mLs Intravenous Stopped 01/22/18 0751)  sodium chloride 0.9 % bolus 1,000 mL (0 mLs Intravenous Stopped 01/22/18 0854)  ketorolac (TORADOL) 30 MG/ML injection 15 mg (15 mg Intravenous Given 01/22/18 0751)  valproate (DEPACON) 500 mg in dextrose 5 % 50 mL IVPB (0 mg Intravenous Stopped 01/22/18 1020)  acetaminophen (TYLENOL) tablet 650 mg (650 mg Oral Given 01/22/18 0917)  SUMAtriptan (IMITREX) injection 6 mg (6 mg Subcutaneous Given 01/22/18 0917)     Initial Impression / Assessment and Plan / ED Course  I have reviewed the triage vital signs and the nursing notes.  Pertinent labs & imaging  results that were available during my care of the patient were reviewed by me and considered in my medical decision making (see chart for details).     Patient's headache is better.  I think his headache is a recurrent migraine issue.  He is requesting not to get meds that are typically given for migraines and I think he is describing Reglan.  However he cannot tell me the exact name.  Thus he was given Toradol with minimal relief and then started on Depakote, Tylenol and Imitrex.  He is now feeling better and feels well enough for discharge.  He is noted again to be hyperglycemic but without acidosis.  I discussed that this means he does not fact have diabetes and needs to be on  metformin.  I will re-prescribe this form and represcribed his blood pressure medicine as he is asking for this as well.  He has a PCP and was encouraged to follow-up.  His neuro exam is unremarkable and I have a low suspicion for an acute CNS emergency.  Discharged home with return precautions.  Final Clinical Impressions(s) / ED Diagnoses   Final diagnoses:  Hyperglycemia  Migraine without status migrainosus, not intractable, unspecified migraine type    ED Discharge Orders        Ordered    metFORMIN (GLUCOPHAGE) 500 MG tablet  2 times daily with meals,   Status:  Discontinued     01/22/18 1027    benazepril-hydrochlorthiazide (LOTENSIN HCT) 20-25 MG tablet  Daily,   Status:  Discontinued     01/22/18 1027    SUMAtriptan (IMITREX) 25 MG tablet  Every 2 hours PRN,   Status:  Discontinued     01/22/18 1027    benazepril-hydrochlorthiazide (LOTENSIN HCT) 20-25 MG tablet  Daily     01/22/18 1028    metFORMIN (GLUCOPHAGE) 500 MG tablet  2 times daily with meals     01/22/18 1028    SUMAtriptan (IMITREX) 25 MG tablet  Every 2 hours PRN     01/22/18 1028       Pricilla LovelessGoldston, Mikaylah Libbey, MD 01/22/18 1038

## 2018-01-22 NOTE — ED Triage Notes (Addendum)
Pt c/o headache with photosensitivity x 1 day; pt states he feels like his CBG is high

## 2018-01-26 ENCOUNTER — Encounter (HOSPITAL_COMMUNITY): Payer: Self-pay | Admitting: Emergency Medicine

## 2018-01-26 ENCOUNTER — Emergency Department (HOSPITAL_COMMUNITY)
Admission: EM | Admit: 2018-01-26 | Discharge: 2018-01-27 | Disposition: A | Discharge status: Home / Self Care | Payer: Self-pay | Attending: Emergency Medicine | Admitting: Emergency Medicine

## 2018-01-26 ENCOUNTER — Other Ambulatory Visit: Payer: Self-pay

## 2018-01-26 DIAGNOSIS — E119 Type 2 diabetes mellitus without complications: Secondary | ICD-10-CM | POA: Insufficient documentation

## 2018-01-26 DIAGNOSIS — Z79899 Other long term (current) drug therapy: Secondary | ICD-10-CM | POA: Insufficient documentation

## 2018-01-26 DIAGNOSIS — R739 Hyperglycemia, unspecified: Secondary | ICD-10-CM | POA: Insufficient documentation

## 2018-01-26 DIAGNOSIS — Z87891 Personal history of nicotine dependence: Secondary | ICD-10-CM | POA: Insufficient documentation

## 2018-01-26 DIAGNOSIS — E860 Dehydration: Secondary | ICD-10-CM | POA: Insufficient documentation

## 2018-01-26 DIAGNOSIS — I10 Essential (primary) hypertension: Secondary | ICD-10-CM | POA: Insufficient documentation

## 2018-01-26 HISTORY — DX: Type 2 diabetes mellitus without complications: E11.9

## 2018-01-26 LAB — BASIC METABOLIC PANEL
Anion gap: 14 (ref 5–15)
BUN: 23 mg/dL — ABNORMAL HIGH (ref 6–20)
CHLORIDE: 86 mmol/L — AB (ref 98–111)
CO2: 23 mmol/L (ref 22–32)
Calcium: 9.3 mg/dL (ref 8.9–10.3)
Creatinine, Ser: 1.32 mg/dL — ABNORMAL HIGH (ref 0.61–1.24)
GFR calc Af Amer: >60 mL/min (ref >60)
GFR calc non Af Amer: >60 mL/min (ref >60)
Glucose, Bld: 611 mg/dL (ref 70–99)
Potassium: 3.7 mmol/L (ref 3.5–5.1)
SODIUM: 123 mmol/L — AB (ref 135–145)

## 2018-01-26 LAB — CBC WITH DIFFERENTIAL/PLATELET
Basophils Absolute: 0 10*3/uL (ref 0.0–0.1)
Basophils Relative: 0 %
Eosinophils Absolute: 0 10*3/uL (ref 0.0–0.7)
Eosinophils Relative: 1 %
HCT: 47.5 % (ref 39.0–52.0)
HEMOGLOBIN: 16.8 g/dL (ref 13.0–17.0)
LYMPHS ABS: 3.4 10*3/uL (ref 0.7–4.0)
LYMPHS PCT: 50 %
MCH: 30 pg (ref 26.0–34.0)
MCHC: 35.4 g/dL (ref 30.0–36.0)
MCV: 84.8 fL (ref 78.0–100.0)
Monocytes Absolute: 0.4 10*3/uL (ref 0.1–1.0)
Monocytes Relative: 6 %
Neutro Abs: 2.9 10*3/uL (ref 1.7–7.7)
Neutrophils Relative %: 43 %
Platelets: 278 10*3/uL (ref 150–400)
RBC: 5.6 MIL/uL (ref 4.22–5.81)
RDW: 12.4 % (ref 11.5–15.5)
WBC: 6.7 10*3/uL (ref 4.0–10.5)

## 2018-01-26 LAB — CBG MONITORING, ED: Glucose-Capillary: >600 mg/dL (ref 70–99)

## 2018-01-26 MED ORDER — SODIUM CHLORIDE 0.9 % IV BOLUS
1000.0000 mL | Freq: Once | INTRAVENOUS | Status: AC
  Administered 2018-01-26: 1000 mL via INTRAVENOUS

## 2018-01-26 MED ORDER — INSULIN ASPART 100 UNIT/ML ~~LOC~~ SOLN
14.0000 [IU] | Freq: Once | SUBCUTANEOUS | Status: AC
  Administered 2018-01-27: 14 [IU] via INTRAVENOUS
  Filled 2018-01-26: qty 1

## 2018-01-26 NOTE — ED Triage Notes (Signed)
Checked bs and over 600. C/o urinary freq and gen weakness. Fatigue noted.

## 2018-01-27 LAB — URINALYSIS, ROUTINE W REFLEX MICROSCOPIC
BILIRUBIN URINE: NEGATIVE
Bacteria, UA: NONE SEEN
HGB URINE DIPSTICK: NEGATIVE
KETONES UR: 20 mg/dL — AB
LEUKOCYTES UA: NEGATIVE
NITRITE: NEGATIVE
PH: 5 (ref 5.0–8.0)
PROTEIN: NEGATIVE mg/dL
Specific Gravity, Urine: 1.026 (ref 1.005–1.030)

## 2018-01-27 LAB — CBG MONITORING, ED: Glucose-Capillary: 359 mg/dL — ABNORMAL HIGH (ref 70–99)

## 2018-01-27 MED ORDER — ACETAMINOPHEN 325 MG PO TABS
650.0000 mg | ORAL_TABLET | Freq: Once | ORAL | Status: AC
  Administered 2018-01-27: 650 mg via ORAL
  Filled 2018-01-27: qty 2

## 2018-01-27 MED ORDER — INSULIN ASPART 100 UNIT/ML ~~LOC~~ SOLN
5.0000 [IU] | Freq: Once | SUBCUTANEOUS | Status: AC
  Administered 2018-01-27: 5 [IU] via SUBCUTANEOUS

## 2018-01-27 NOTE — ED Provider Notes (Signed)
[facility] EMERGENCY DEPARTMENT Provider Note   CSN: [id number] Arrival date & time: 01/26/18  2228     History   Chief Complaint Chief Complaint  Patient presents with  . Hyperglycemia    600    HPI Corey [name] is a 37 y.o. male.  HPI  37 year old male with history of diabetes, hypertension comes in with chief complaint of elevated blood sugar.  Patient reports that he is having urinary frequency along with generalized weakness and fatigue.  Patient has been feeling this way for the past few days, and his blood sugars starting yesterday have been elevated.  Patient has been taking metformin 500 mg twice daily for the last several weeks.  He informs me that he has not been officially diagnosed by diabetes by his PCP, and that the metformin is taking right now was prescribed by the emergency medicine physician.  Review of system is negative for any nausea, vomiting, fevers, chills, cough, chest pain, severe headaches, rash.  Patient has been taking his medications as prescribed.  Past Medical History:  Diagnosis Date  . Chronic back pain   . Diabetes mellitus without complication (HCC)   . Hypertension   . Migraine     There are no active problems to display for this patient.   Past Surgical History:  Procedure Laterality Date  . WRIST SURGERY  right        Home Medications    Prior to Admission medications   Medication Sig Start Date End Date Taking? Authorizing Provider  benazepril-hydrochlorthiazide (LOTENSIN HCT) 20-25 MG tablet Take 1 tablet by mouth daily. 01/22/18   Pricilla Loveless, MD  diphenhydrAMINE (BENADRYL) 25 MG tablet Take 25 mg by mouth every 6 (six) hours as needed for itching.    [provider]  HYDROcodone-acetaminophen (NORCO/VICODIN) 5-325 MG tablet Take 1 tablet by mouth every 4 (four) hours as needed. 11/10/17   Burgess Amor, PA-C  metFORMIN (GLUCOPHAGE) 500 MG tablet Take 1 tablet (500 mg total) by mouth 2 (two) times daily with  a meal. 01/22/18   Pricilla Loveless, MD  SUMAtriptan (IMITREX) 25 MG tablet Take 1 tablet (25 mg total) by mouth every 2 (two) hours as needed for migraine. May repeat in 2 hours if headache persists or recurs. 01/22/18   Pricilla Loveless, MD    Family History History reviewed. No pertinent family history.  Social History Social History   Tobacco Use  . Smoking status: Former Smoker    Packs/day: 0.50    Types: Cigarettes  . Smokeless tobacco: Never Used  Substance Use Topics  . Alcohol use: No  . Drug use: No     Allergies   Morphine and related   Review of Systems Review of Systems  Constitutional: Positive for activity change and fatigue. Negative for fever.  Respiratory: Negative for cough and shortness of breath.   Cardiovascular: Negative for chest pain.  Gastrointestinal: Negative for nausea and vomiting.  Genitourinary: Positive for frequency.  Skin: Negative for rash.  Neurological: Positive for weakness.  All other systems reviewed and are negative.    Physical Exam Updated Vital Signs BP 134/87 (BP Location: Right Arm)   Pulse 96   Temp 98.1 F (36.7 C) (Oral)   Resp 18   Ht 6\' 2"  (1.88 m)   Wt 117.9 kg (260 lb)   SpO2 96%   BMI 33.38 kg/m   Physical Exam  Constitutional: He is oriented to person, place, and time. He appears well-developed.  HENT:  Head: Atraumatic.  Neck: Neck supple.  Cardiovascular: Normal rate.  Pulmonary/Chest: Effort normal.  Abdominal: Soft.  Musculoskeletal: He exhibits no edema or tenderness.  Neurological: He is alert and oriented to person, place, and time.  Skin: Skin is warm.  Nursing note and vitals reviewed.    ED Treatments / Results  Labs (all labs ordered are listed, but only abnormal results are displayed) Labs Reviewed  BASIC METABOLIC PANEL - Abnormal; Notable for the following components:      Result Value   Sodium 123 (*)    Chloride 86 (*)    Glucose, Bld 611 (*)    BUN 23 (*)    Creatinine,  Ser 1.32 (*)    All other components within normal limits  URINALYSIS, ROUTINE W REFLEX MICROSCOPIC - Abnormal; Notable for the following components:   Color, Urine STRAW (*)    Glucose, UA >=500 (*)    Ketones, ur 20 (*)    All other components within normal limits  CBG MONITORING, ED - Abnormal; Notable for the following components:   Glucose-Capillary >600 (*)    All other components within normal limits  CBG MONITORING, ED - Abnormal; Notable for the following components:   Glucose-Capillary 359 (*)    All other components within normal limits  CBC WITH DIFFERENTIAL/PLATELET  CBG MONITORING, ED    EKG None  Radiology No results found.  Procedures Procedures (including critical care time)  Medications Ordered in ED Medications  insulin aspart (novoLOG) injection 5 Units (has no administration in time range)  sodium chloride 0.9 % bolus 1,000 mL (1,000 mLs Intravenous New Bag/Given 01/26/18 2352)  insulin aspart (novoLOG) injection 14 Units (14 Units Intravenous Given 01/27/18 0000)  acetaminophen (TYLENOL) tablet 650 mg (650 mg Oral Given 01/27/18 0033)     Initial Impression / Assessment and Plan / ED Course  I have reviewed the triage vital signs and the nursing notes.  Pertinent labs & imaging results that were available during my care of the patient were reviewed by me and considered in my medical decision making (see chart for details).  Clinical Course as of Jan 28 120  Sat Jan 27, 2018  0120 Blood sugar has improved dramatically with IV insulin and fluids. We will give him 5 more units of subcutaneous insulin. He is stable for discharge.  Glucose-Capillary(!): 359 [AN]  0121 Patient will be calling his PCP for the earliest appointment available.   [AN]    Clinical Course User Index [AN] Derwood KaplanNanavati, Cloey Sferrazza, MD    37 year old male comes in with chief complaint of weakness and elevated blood sugar.  Patient has history of type 2 diabetes, hypertension and seems  like his diabetes is not well controlled.  Patient informs me that he has been compliant with his medications, therefore I think he just needs to be optimized better by PCP.  So far it seems like his medications have been dispensed by EDPs.  More importantly, patient does not have any evidence of underlying infection.  Labs do not show DKA.  Patient has been given 1 L of fluid and insulin.  Part of his symptoms could be because of dehydration given his urinary frequency.  Final Clinical Impressions(s) / ED Diagnoses   Final diagnoses:  Dehydration  Acute hyperglycemia    ED Discharge Orders    None       Derwood KaplanNanavati, Mindy Gali, MD 01/27/18 0121

## 2018-01-27 NOTE — Discharge Instructions (Addendum)
You are seen in the ER for elevated blood sugar. It appears clear to Corey [name] that your blood sugars have been running high the last few visits to the ER, and that you indeed have diabetes which needs to be optimally managed. Unfortunately, your primary care doctor is the best person who can manage your diabetes.  Therefore it is prudent that you call them for an appointment as soon as possible.  Read the instructions provided on glucose control. Take the medications that have been prescribed to you. Hydrate well. Return to the ER if your symptoms get worse.

## 2018-09-23 ENCOUNTER — Encounter (HOSPITAL_COMMUNITY): Payer: Self-pay | Admitting: Emergency Medicine

## 2018-09-23 ENCOUNTER — Other Ambulatory Visit: Payer: Self-pay

## 2018-09-23 ENCOUNTER — Emergency Department (HOSPITAL_COMMUNITY)
Admission: EM | Admit: 2018-09-23 | Discharge: 2018-09-23 | Disposition: A | Discharge status: Home / Self Care | Payer: Self-pay | Attending: Emergency Medicine | Admitting: Emergency Medicine

## 2018-09-23 DIAGNOSIS — Z7984 Long term (current) use of oral hypoglycemic drugs: Secondary | ICD-10-CM | POA: Insufficient documentation

## 2018-09-23 DIAGNOSIS — G43009 Migraine without aura, not intractable, without status migrainosus: Secondary | ICD-10-CM | POA: Insufficient documentation

## 2018-09-23 DIAGNOSIS — E119 Type 2 diabetes mellitus without complications: Secondary | ICD-10-CM | POA: Insufficient documentation

## 2018-09-23 DIAGNOSIS — Z79899 Other long term (current) drug therapy: Secondary | ICD-10-CM | POA: Insufficient documentation

## 2018-09-23 DIAGNOSIS — I10 Essential (primary) hypertension: Secondary | ICD-10-CM | POA: Insufficient documentation

## 2018-09-23 DIAGNOSIS — Z87891 Personal history of nicotine dependence: Secondary | ICD-10-CM | POA: Insufficient documentation

## 2018-09-23 MED ORDER — DIPHENHYDRAMINE HCL 50 MG/ML IJ SOLN
50.0000 mg | Freq: Once | INTRAMUSCULAR | Status: AC
  Administered 2018-09-23: 50 mg via INTRAMUSCULAR
  Filled 2018-09-23: qty 1

## 2018-09-23 MED ORDER — PROCHLORPERAZINE EDISYLATE 10 MG/2ML IJ SOLN
10.0000 mg | Freq: Once | INTRAMUSCULAR | Status: AC
  Administered 2018-09-23: 10 mg via INTRAMUSCULAR
  Filled 2018-09-23: qty 2

## 2018-09-23 MED ORDER — DEXAMETHASONE SODIUM PHOSPHATE 10 MG/ML IJ SOLN
10.0000 mg | Freq: Once | INTRAMUSCULAR | Status: DC
  Filled 2018-09-23: qty 1

## 2018-09-23 MED ORDER — KETOROLAC TROMETHAMINE 60 MG/2ML IM SOLN
60.0000 mg | Freq: Once | INTRAMUSCULAR | Status: AC
  Administered 2018-09-23: 60 mg via INTRAMUSCULAR
  Filled 2018-09-23: qty 2

## 2018-09-23 NOTE — ED Notes (Signed)
Patient unable to sign due to signature pad not working, Discharge instruction covered and given to patient.

## 2018-09-23 NOTE — ED Triage Notes (Signed)
Pt c/o headache x 1.5 days, pt reports taking Excedrin Migraine yesterday at 1500, pt reports he had prescription meds (unsure of medicine name) but ran out about 1 mo ago

## 2018-09-23 NOTE — ED Provider Notes (Signed)
[facility] EMERGENCY DEPARTMENT Provider Note   CSN: [id number] Arrival date & time: 09/23/18  4008    History   Chief Complaint Chief Complaint  Patient presents with   Headache    HPI Corey [name] is a 38 y.o. male.     Patient presents to the emergency department for evaluation of headache.  Patient has a history of migraines.  He has been experiencing a headache for more than 1 day that is similar to previous migraines.  He has a throbbing pain behind both eyes that radiates to the top of his head.  He has sensitivity to light and has had nausea but no vomiting.  There is no fever or neck pain, stiffness.  He has not noticed any numbness, tingling, weakness in extremities or other unusual symptoms, this is a typical migraine for him.  He took Excedrin Migraine without improvement.     Past Medical History:  Diagnosis Date   Chronic back pain    Diabetes mellitus without complication (HCC)    Hypertension    Migraine     There are no active problems to display for this patient.   Past Surgical History:  Procedure Laterality Date   WRIST SURGERY  right        Home Medications    Prior to Admission medications   Medication Sig Start Date End Date Taking? Authorizing Provider  benazepril-hydrochlorthiazide (LOTENSIN HCT) 20-25 MG tablet Take 1 tablet by mouth daily. 01/22/18   Pricilla Loveless, MD  diphenhydrAMINE (BENADRYL) 25 MG tablet Take 25 mg by mouth every 6 (six) hours as needed for itching.    [provider]  HYDROcodone-acetaminophen (NORCO/VICODIN) 5-325 MG tablet Take 1 tablet by mouth every 4 (four) hours as needed. 11/10/17   Burgess Amor, PA-C  metFORMIN (GLUCOPHAGE) 500 MG tablet Take 1 tablet (500 mg total) by mouth 2 (two) times daily with a meal. 01/22/18   Pricilla Loveless, MD  SUMAtriptan (IMITREX) 25 MG tablet Take 1 tablet (25 mg total) by mouth every 2 (two) hours as needed for migraine. May repeat in 2 hours if headache  persists or recurs. 01/22/18   Pricilla Loveless, MD    Family History History reviewed. No pertinent family history.  Social History Social History   Tobacco Use   Smoking status: Former Smoker    Packs/day: 0.50    Types: Cigarettes   Smokeless tobacco: Never Used  Substance Use Topics   Alcohol use: No   Drug use: No     Allergies   Morphine and related   Review of Systems Review of Systems  Gastrointestinal: Positive for nausea.  Neurological: Positive for headaches.  All other systems reviewed and are negative.    Physical Exam Updated Vital Signs BP (!) 178/113 (BP Location: Right Arm)    Pulse 75    Temp 98.1 F (36.7 C) (Oral)    Resp 18    Ht 6\' 2"  (1.88 m)    Wt 124.7 kg    SpO2 98%    BMI 35.31 kg/m   Physical Exam Vitals signs and nursing note reviewed.  Constitutional:      General: He is not in acute distress.    Appearance: Normal appearance. He is well-developed.  HENT:     Head: Normocephalic and atraumatic.     Right Ear: Hearing normal.     Left Ear: Hearing normal.     Nose: Nose normal.  Eyes:     Conjunctiva/sclera: Conjunctivae normal.  Pupils: Pupils are equal, round, and reactive to light.  Neck:     Musculoskeletal: Normal range of motion and neck supple.  Cardiovascular:     Rate and Rhythm: Regular rhythm.     Heart sounds: S1 normal and S2 normal. No murmur. No friction rub. No gallop.   Pulmonary:     Effort: Pulmonary effort is normal. No respiratory distress.     Breath sounds: Normal breath sounds.  Chest:     Chest wall: No tenderness.  Abdominal:     General: Bowel sounds are normal.     Palpations: Abdomen is soft.     Tenderness: There is no abdominal tenderness. There is no guarding or rebound. Negative signs include Murphy's sign and McBurney's sign.     Hernia: No hernia is present.  Musculoskeletal: Normal range of motion.  Skin:    General: Skin is warm and dry.     Findings: No rash.  Neurological:      Mental Status: He is alert and oriented to person, place, and time.     GCS: GCS eye subscore is 4. GCS verbal subscore is 5. GCS motor subscore is 6.     Cranial Nerves: No cranial nerve deficit.     Sensory: No sensory deficit.     Coordination: Coordination normal.  Psychiatric:        Speech: Speech normal.        Behavior: Behavior normal.        Thought Content: Thought content normal.      ED Treatments / Results  Labs (all labs ordered are listed, but only abnormal results are displayed) Labs Reviewed - No data to display  EKG None  Radiology No results found.  Procedures Procedures (including critical care time)  Medications Ordered in ED Medications - No data to display   Initial Impression / Assessment and Plan / ED Course  I have reviewed the triage vital signs and the nursing notes.  Pertinent labs & imaging results that were available during my care of the patient were reviewed by me and considered in my medical decision making (see chart for details).        Patient presents to the emergency department for evaluation of migraine headache. Patient is currently experiencing a headache that is similar to previous migraines. Patient does not have any unusual features compared to previous migraines. Patient has normal neurologic examination. There are no unusual features, such as unusual intensity or sudden onset. As this headache is similar to previous migraines, there is no concern for subarachnoid hemorrhage or other etiology. Patient therefore does not require imaging. Patient treated as migraine headache.  Final Clinical Impressions(s) / ED Diagnoses   Final diagnoses:  Migraine without aura and without status migrainosus, not intractable    ED Discharge Orders    None       Sneha Willig, Canary Brim, MD 09/23/18 [phone]

## 2020-03-20 ENCOUNTER — Ambulatory Visit
Admission: EM | Admit: 2020-03-20 | Discharge: 2020-03-20 | Disposition: A | Discharge status: Home / Self Care | Payer: Self-pay | Attending: Internal Medicine | Admitting: Internal Medicine

## 2020-03-20 ENCOUNTER — Encounter: Payer: Self-pay | Admitting: Emergency Medicine

## 2020-03-20 ENCOUNTER — Other Ambulatory Visit: Payer: Self-pay

## 2020-03-20 DIAGNOSIS — I10 Essential (primary) hypertension: Secondary | ICD-10-CM | POA: Insufficient documentation

## 2020-03-20 DIAGNOSIS — L293 Anogenital pruritus, unspecified: Secondary | ICD-10-CM | POA: Insufficient documentation

## 2020-03-20 DIAGNOSIS — Z113 Encounter for screening for infections with a predominantly sexual mode of transmission: Secondary | ICD-10-CM | POA: Insufficient documentation

## 2020-03-20 DIAGNOSIS — B49 Unspecified mycosis: Secondary | ICD-10-CM | POA: Insufficient documentation

## 2020-03-20 DIAGNOSIS — R81 Glycosuria: Secondary | ICD-10-CM | POA: Insufficient documentation

## 2020-03-20 LAB — CHLAMYDIA/NGC RT PCR (ARMC ONLY)
Chlamydia Tr: NOT DETECTED
N gonorrhoeae: NOT DETECTED

## 2020-03-20 LAB — URINALYSIS, COMPLETE (UACMP) WITH MICROSCOPIC
Bacteria, UA: NONE SEEN
Bilirubin Urine: NEGATIVE
Glucose, UA: 500 mg/dL — AB
Hgb urine dipstick: NEGATIVE
Ketones, ur: NEGATIVE mg/dL
Leukocytes,Ua: NEGATIVE
Nitrite: NEGATIVE
Protein, ur: NEGATIVE mg/dL
Specific Gravity, Urine: 1.01 (ref 1.005–1.030)
pH: 6 (ref 5.0–8.0)

## 2020-03-20 MED ORDER — FLUCONAZOLE 150 MG PO TABS: ORAL_TABLET | ORAL | 0 refills | Status: AC

## 2020-03-20 MED ORDER — BENAZEPRIL-HYDROCHLOROTHIAZIDE 20-25 MG PO TABS: 1.0000 | ORAL_TABLET | Freq: Every day | ORAL | 1 refills | Status: AC

## 2020-03-20 NOTE — ED Provider Notes (Signed)
[facility]    CSN: [id number] Arrival date & time: 03/20/20  1932      History   Chief Complaint Chief Complaint  Patient presents with  . penis problem    HPI Corey [name] is a 39 y.o. male.   Patient presenting for 2-week history of itching of the penis.  As he has been trying to increase his fluid intake and that seems to not be helping.  He denies any known exposure to STIs.  Patient says he is actually up to negative STI test since his symptoms started.  He denies any pain with urination.  He says that he did see some tan "stuff" that he was able to pick out recently.  He denies any rashes or skin lesions.  He denies testicular pain or swelling.  He denies back pain or abdominal pain or pelvic pain.  Patient is a type II diabetic and says that his blood sugar is not well controlled.  Patient also says that he has hypertension and has not taken his blood pressure medicine in a couple of months.  He states that he is out of the medicine.  He denies any significant symptoms such as headaches, vision changes, chest pain, breathing difficulty or dizziness.  No other concerns today.  HPI  Past Medical History:  Diagnosis Date  . Chronic back pain   . Diabetes mellitus without complication (HCC)   . Hypertension   . Migraine     There are no problems to display for this patient.   Past Surgical History:  Procedure Laterality Date  . WRIST SURGERY  right       Home Medications    Prior to Admission medications   Medication Sig Start Date End Date Taking? Authorizing Provider  diphenhydrAMINE (BENADRYL) 25 MG tablet Take 25 mg by mouth every 6 (six) hours as needed for itching.   Yes [provider]  metFORMIN (GLUCOPHAGE) 500 MG tablet Take 1 tablet (500 mg total) by mouth 2 (two) times daily with a meal. 01/22/18  Yes Pricilla Loveless, MD  benazepril-hydrochlorthiazide (LOTENSIN HCT) 20-25 MG tablet Take 1 tablet by mouth daily. 03/20/20 04/19/20   Eusebio Friendly B, PA-C  fluconazole (DIFLUCAN) 150 MG tablet Take 1 tab PO q72h 03/20/20 03/27/20  Eusebio Friendly B, PA-C  HYDROcodone-acetaminophen (NORCO/VICODIN) 5-325 MG tablet Take 1 tablet by mouth every 4 (four) hours as needed. 11/10/17   Burgess Amor, PA-C  SUMAtriptan (IMITREX) 25 MG tablet Take 1 tablet (25 mg total) by mouth every 2 (two) hours as needed for migraine. May repeat in 2 hours if headache persists or recurs. 01/22/18   Pricilla Loveless, MD    Family History Family History  Problem Relation Age of Onset  . Healthy Mother   . Healthy Father     Social History Social History   Tobacco Use  . Smoking status: Former Smoker    Packs/day: 0.50    Types: Cigarettes  . Smokeless tobacco: Never Used  Vaping Use  . Vaping Use: Never used  Substance Use Topics  . Alcohol use: Yes  . Drug use: No     Allergies   Decadron [dexamethasone] and Morphine and related   Review of Systems Review of Systems  Constitutional: Negative for fatigue and fever.  Eyes: Negative for discharge.  Gastrointestinal: Negative for abdominal pain, nausea and vomiting.  Genitourinary: Negative for discharge, dysuria, frequency, genital sores, hematuria, penile pain, penile swelling, scrotal swelling, testicular pain and urgency.  Positive for penile itching.  Musculoskeletal: Negative for back pain.  Skin: Negative for rash.  Neurological: Negative for weakness.     Physical Exam Triage Vital Signs ED Triage Vitals  Enc Vitals Group     BP 03/20/20 1946 (!) 166/112     Pulse Rate 03/20/20 1946 89     Resp 03/20/20 1946 18     Temp 03/20/20 1946 98.9 F (37.2 C)     Temp Source 03/20/20 1946 Oral     SpO2 03/20/20 1946 97 %     Weight 03/20/20 1943 274 lb 14.6 oz (124.7 kg)     Height 03/20/20 1943 6\' 2"  (1.88 m)     Head Circumference --      Peak Flow --      Pain Score 03/20/20 1943 0     Pain Loc --      Pain Edu? --      Excl. in GC? --    No data  found.  Updated Vital Signs BP (!) 166/112 (BP Location: Left Arm) Comment: Pt states he has not taken his BP medication  Pulse 89   Temp 98.9 F (37.2 C) (Oral)   Resp 18   Ht 6\' 2"  (1.88 m)   Wt 274 lb 14.6 oz (124.7 kg)   SpO2 97%   BMI 35.30 kg/m       Physical Exam Vitals and nursing note reviewed.  Constitutional:      General: He is not in acute distress.    Appearance: Normal appearance. He is well-developed. He is obese. He is not ill-appearing or toxic-appearing.  HENT:     Head: Normocephalic and atraumatic.  Eyes:     General: No scleral icterus.    Conjunctiva/sclera: Conjunctivae normal.  Cardiovascular:     Rate and Rhythm: Normal rate and regular rhythm.     Heart sounds: Normal heart sounds.  Pulmonary:     Effort: Pulmonary effort is normal. No respiratory distress.     Breath sounds: Normal breath sounds.  Abdominal:     Palpations: Abdomen is soft.     Tenderness: There is no abdominal tenderness. There is no right CVA tenderness.  Musculoskeletal:     Cervical back: Neck supple.  Skin:    General: Skin is warm and dry.  Neurological:     General: No focal deficit present.     Mental Status: He is alert. Mental status is at baseline.     Motor: No weakness.     Gait: Gait normal.  Psychiatric:        Mood and Affect: Mood normal.        Thought Content: Thought content normal.      UC Treatments / Results  Labs (all labs ordered are listed, but only abnormal results are displayed) Labs Reviewed  URINALYSIS, COMPLETE (UACMP) WITH MICROSCOPIC - Abnormal; Notable for the following components:      Result Value   Glucose, UA 500 (*)    All other components within normal limits  CHLAMYDIA/NGC RT PCR [facility] ONLY)    EKG   Radiology No results found.  Procedures Procedures (including critical care time)  Medications Ordered in UC Medications - No data to display  Initial Impression / Assessment and Plan / UC Course  I  have reviewed the triage vital signs and the nursing notes.  Pertinent labs & imaging results that were available during my care of the patient were reviewed by me and considered in my  medical decision making (see chart for details).   UA significant for glucosuria.  Advised patient that he needs to have better control over his diabetes.  Explained that it can lead to the genital yeast infections which he likely has.  Treating with Diflucan at this time.  Waiting to treat for any STIs until results return.  I have restarted the hypertension medication.  Advise follow-up with PCP.  Final Clinical Impressions(s) / UC Diagnoses   Final diagnoses:  Pruritus of penis  Fungal infection  Glucosuria  Screening examination for sexually transmitted disease  Essential hypertension     Discharge Instructions     A urinalysis obtained today shows your blood sugar in your urine, which means that your blood sugar is very high.  Your diabetes is apparently not well controlled and uncontrolled diabetes can lead to genital yeast infections.  Your symptoms are consistent with a genital yeast infection.  Begin Diflucan at this time.  You can access your STI results through MyChart sometime tomorrow.  If positive you will need to be treated.  I have restarted your hypertension medication but you will need to follow-up with your PCP for refills.    ED Prescriptions    Medication Sig Dispense Auth. Provider   benazepril-hydrochlorthiazide (LOTENSIN HCT) 20-25 MG tablet Take 1 tablet by mouth daily. 30 tablet Shirlee Latch, PA-C   fluconazole (DIFLUCAN) 150 MG tablet Take 1 tab PO q72h 2 tablet Shirlee Latch, PA-C     PDMP not reviewed this encounter.   Shirlee Latch, PA-C 03/20/20 2024

## 2020-03-20 NOTE — Discharge Instructions (Signed)
A urinalysis obtained today shows your blood sugar in your urine, which means that your blood sugar is very high.  Your diabetes is apparently not well controlled and uncontrolled diabetes can lead to genital yeast infections.  Your symptoms are consistent with a genital yeast infection.  Begin Diflucan at this time.  You can access your STI results through MyChart sometime tomorrow.  If positive you will need to be treated.  I have restarted your hypertension medication but you will need to follow-up with your PCP for refills.

## 2020-03-20 NOTE — ED Triage Notes (Signed)
Pt c/o itching on his penis. Started about 2 weeks ago. He states he has been drinking more water and drinking cranberry juice. He known exposure to stds.

## 2020-03-23 ENCOUNTER — Other Ambulatory Visit: Payer: Self-pay

## 2020-03-23 ENCOUNTER — Emergency Department
Admission: EM | Admit: 2020-03-23 | Discharge: 2020-03-23 | Disposition: A | Discharge status: Home / Self Care | Payer: Self-pay | Attending: Emergency Medicine | Admitting: Emergency Medicine

## 2020-03-23 ENCOUNTER — Ambulatory Visit: Admit: 2020-03-23 | Disposition: A | Discharge status: Home / Self Care | Payer: Self-pay

## 2020-03-23 ENCOUNTER — Encounter: Payer: Self-pay | Admitting: Emergency Medicine

## 2020-03-23 DIAGNOSIS — Z7984 Long term (current) use of oral hypoglycemic drugs: Secondary | ICD-10-CM | POA: Insufficient documentation

## 2020-03-23 DIAGNOSIS — Z20822 Contact with and (suspected) exposure to covid-19: Secondary | ICD-10-CM | POA: Insufficient documentation

## 2020-03-23 DIAGNOSIS — Z87891 Personal history of nicotine dependence: Secondary | ICD-10-CM | POA: Insufficient documentation

## 2020-03-23 DIAGNOSIS — Z79899 Other long term (current) drug therapy: Secondary | ICD-10-CM | POA: Insufficient documentation

## 2020-03-23 DIAGNOSIS — E1165 Type 2 diabetes mellitus with hyperglycemia: Secondary | ICD-10-CM | POA: Insufficient documentation

## 2020-03-23 DIAGNOSIS — I10 Essential (primary) hypertension: Secondary | ICD-10-CM | POA: Insufficient documentation

## 2020-03-23 DIAGNOSIS — R739 Hyperglycemia, unspecified: Secondary | ICD-10-CM

## 2020-03-23 LAB — BASIC METABOLIC PANEL
Anion gap: 12 (ref 5–15)
BUN: 22 mg/dL — ABNORMAL HIGH (ref 6–20)
CO2: 26 mmol/L (ref 22–32)
Calcium: 9.3 mg/dL (ref 8.9–10.3)
Chloride: 92 mmol/L — ABNORMAL LOW (ref 98–111)
Creatinine, Ser: 1.04 mg/dL (ref 0.61–1.24)
GFR calc Af Amer: >60 mL/min (ref >60)
GFR calc non Af Amer: >60 mL/min (ref >60)
Glucose, Bld: 455 mg/dL — ABNORMAL HIGH (ref 70–99)
Potassium: 4.6 mmol/L (ref 3.5–5.1)
Sodium: 130 mmol/L — ABNORMAL LOW (ref 135–145)

## 2020-03-23 LAB — CBC
HCT: 48.5 % (ref 39.0–52.0)
Hemoglobin: 17.3 g/dL — ABNORMAL HIGH (ref 13.0–17.0)
MCH: 30.1 pg (ref 26.0–34.0)
MCHC: 35.7 g/dL (ref 30.0–36.0)
MCV: 84.3 fL (ref 80.0–100.0)
Platelets: 298 10*3/uL (ref 150–400)
RBC: 5.75 MIL/uL (ref 4.22–5.81)
RDW: 12.2 % (ref 11.5–15.5)
WBC: 6.2 10*3/uL (ref 4.0–10.5)
nRBC: 0 % (ref 0.0–0.2)

## 2020-03-23 LAB — URINALYSIS, COMPLETE (UACMP) WITH MICROSCOPIC
Bilirubin Urine: NEGATIVE
Glucose, UA: >=500 mg/dL — AB
Hgb urine dipstick: NEGATIVE
Ketones, ur: 20 mg/dL — AB
Nitrite: NEGATIVE
Protein, ur: NEGATIVE mg/dL
Specific Gravity, Urine: 1.029 (ref 1.005–1.030)
pH: 5 (ref 5.0–8.0)

## 2020-03-23 LAB — SARS CORONAVIRUS 2 BY RT PCR (HOSPITAL ORDER, PERFORMED IN ~~LOC~~ HOSPITAL LAB): SARS Coronavirus 2: NEGATIVE

## 2020-03-23 LAB — GLUCOSE, CAPILLARY
Glucose-Capillary: 455 mg/dL — ABNORMAL HIGH (ref 70–99)
Glucose-Capillary: 218 mg/dL — ABNORMAL HIGH (ref 70–99)

## 2020-03-23 MED ORDER — SODIUM CHLORIDE 0.9 % IV SOLN
Freq: Once | INTRAVENOUS | Status: AC
  Administered 2020-03-23: 1000 mL via INTRAVENOUS

## 2020-03-23 MED ORDER — SODIUM CHLORIDE 0.9 % IV BOLUS
1000.0000 mL | Freq: Once | INTRAVENOUS | Status: AC
  Administered 2020-03-23: 1000 mL via INTRAVENOUS

## 2020-03-23 MED ORDER — INSULIN ASPART 100 UNIT/ML ~~LOC~~ SOLN
10.0000 [IU] | Freq: Once | SUBCUTANEOUS | Status: AC
  Administered 2020-03-23: 10 [IU] via INTRAVENOUS
  Filled 2020-03-23: qty 1

## 2020-03-23 NOTE — ED Notes (Signed)
See triage note  Presents with elevated glucose  States his b/s usually runs in 300"s   Denies any specific complains except for some lower back pain

## 2020-03-23 NOTE — ED Notes (Signed)
GLU CHECK 218

## 2020-03-23 NOTE — ED Triage Notes (Signed)
Patient to ER for c/o blood sugar over 600 on home glucometer. Patient was recently seen at urgent care for penile discharge, was given fluconazole and refill on his BP medications. Patient states his blood sugar has been running in the 300 range for quite some time prior to this episode. Patient states he feels normal other than pain to lower back.

## 2020-03-23 NOTE — ED Provider Notes (Signed)
[facility] Emergency Department Provider Note  Time seen: 1:13 PM  I have reviewed the triage vital signs and the nursing notes.   HISTORY  Chief Complaint Hyperglycemia   HPI Corey [name] is a 39 y.o. male with a past medical history of hypertension, diabetes, presents to the emergency department for high blood sugar.  According to the patient over the past week or so his blood sugars have been running in 3-4 and sometimes 500 range at home.  Patient states he was recently diagnosed with a urinary yeast infection and prescribed medication for this.  Patient denies any recent illnesses such as nausea vomiting diarrhea cough or fever.  Patient states he has been drinking a significant amount of water but still feels dehydrated.  Patient states he urinates very frequently.   Past Medical History:  Diagnosis Date  . Chronic back pain   . Diabetes mellitus without complication (HCC)   . Hypertension   . Migraine     There are no problems to display for this patient.   Past Surgical History:  Procedure Laterality Date  . WRIST SURGERY  right    Prior to Admission medications   Medication Sig Start Date End Date Taking? Authorizing Provider  benazepril-hydrochlorthiazide (LOTENSIN HCT) 20-25 MG tablet Take 1 tablet by mouth daily. 03/20/20 04/19/20  Shirlee Latch, PA-C  diphenhydrAMINE (BENADRYL) 25 MG tablet Take 25 mg by mouth every 6 (six) hours as needed for itching.    [provider]  fluconazole (DIFLUCAN) 150 MG tablet Take 1 tab PO q72h 03/20/20 03/27/20  Eusebio Friendly B, PA-C  metFORMIN (GLUCOPHAGE) 500 MG tablet Take 1 tablet (500 mg total) by mouth 2 (two) times daily with a meal. 01/22/18   Pricilla Loveless, MD  SUMAtriptan (IMITREX) 25 MG tablet Take 1 tablet (25 mg total) by mouth every 2 (two) hours as needed for migraine. May repeat in 2 hours if headache persists or recurs. 01/22/18   Pricilla Loveless, MD    Allergies  Allergen  Reactions  . Decadron [Dexamethasone]   . Morphine And Related Itching    Family History  Problem Relation Age of Onset  . Healthy Mother   . Healthy Father     Social History Social History   Tobacco Use  . Smoking status: Former Smoker    Packs/day: 0.50    Types: Cigarettes  . Smokeless tobacco: Never Used  Vaping Use  . Vaping Use: Never used  Substance Use Topics  . Alcohol use: Yes  . Drug use: No    Review of Systems Constitutional: Negative for fever Cardiovascular: Negative for chest pain. Respiratory: Negative for shortness of breath. Gastrointestinal: Negative for abdominal pain, vomiting and diarrhea. Genitourinary: Frequent urination without dysuria Musculoskeletal: Negative for musculoskeletal complaints Neurological: Negative for headache All other ROS negative  ____________________________________________   PHYSICAL EXAM:  VITAL SIGNS: ED Triage Vitals  Enc Vitals Group     BP 03/23/20 1142 126/90     Pulse Rate 03/23/20 1142 65     Resp 03/23/20 1142 20     Temp 03/23/20 1142 98.7 F (37.1 C)     Temp Source 03/23/20 1142 Oral     SpO2 03/23/20 1142 97 %     Weight 03/23/20 1144 274 lb 14.6 oz (124.7 kg)     Height 03/23/20 1144 6\' 2"  (1.88 m)     Head Circumference --      Peak Flow --      Pain  Score 03/23/20 1144 7     Pain Loc --      Pain Edu? --      Excl. in GC? --     Constitutional: Alert and oriented. Well appearing and in no distress. Eyes: Normal exam ENT      Head: Normocephalic and atraumatic.      Mouth/Throat: Mucous membranes are moist. Cardiovascular: Normal rate, regular rhythm.  Respiratory: Normal respiratory effort without tachypnea nor retractions. Breath sounds are clear  Gastrointestinal: Soft and nontender. No distention.  Musculoskeletal: Nontender with normal range of motion in all extremities.  Neurologic:  Normal speech and language. No gross focal neurologic deficits  Skin:  Skin is warm, dry and  intact.  Psychiatric: Mood and affect are normal.  ____________________________________________   INITIAL IMPRESSION / ASSESSMENT AND PLAN / ED COURSE  Pertinent labs & imaging results that were available during my care of the patient were reviewed by me and considered in my medical decision making (see chart for details).   Patient presents emergency department for elevated blood glucose over the past week or more.  Overall patient appears well, reassuring physical exam, no concerning findings on review of systems.  Patient did recently diagnosed with a urinary yeast infection prescribed medication for this but states that symptoms have cleared up.  Denies any dysuria does state urinary frequency.  Patient's lab work shows a significant blood glucose greater than 400 with a slight amount of ketones in his urine but reassuringly normal anion gap.  We will IV hydrate the patient in the emergency department, treat with IV insulin and continue to closely monitor.  Patient agreeable to plan of care.  Patient's blood sugars down to 218.  Patient is feeling much better.  We will discharge the patient home with PCP follow-up.  I discussed return precautions.  [name] was evaluated in Emergency Department on 03/23/2020 for the symptoms described in the history of present illness. He was evaluated in the context of the global COVID-19 pandemic, which necessitated consideration that the patient might be at risk for infection with the SARS-CoV-2 virus that causes COVID-19. Institutional protocols and algorithms that pertain to the evaluation of patients at risk for COVID-19 are in a state of rapid change based on information released by regulatory bodies including the CDC and federal and state organizations. These policies and algorithms were followed during the patient's care in the ED.  ____________________________________________   FINAL CLINICAL IMPRESSION(S) / ED DIAGNOSES  Hyperglycemia    Minna Antis, MD 03/23/20 1731

## 2020-10-04 ENCOUNTER — Other Ambulatory Visit: Payer: Self-pay

## 2020-10-04 ENCOUNTER — Emergency Department (HOSPITAL_COMMUNITY): Payer: Self-pay

## 2020-10-04 ENCOUNTER — Emergency Department (HOSPITAL_COMMUNITY)
Admission: EM | Admit: 2020-10-04 | Discharge: 2020-10-05 | Disposition: A | Discharge status: Home / Self Care | Payer: Self-pay | Attending: Emergency Medicine | Admitting: Emergency Medicine

## 2020-10-04 ENCOUNTER — Encounter (HOSPITAL_COMMUNITY): Payer: Self-pay

## 2020-10-04 DIAGNOSIS — Z87891 Personal history of nicotine dependence: Secondary | ICD-10-CM | POA: Insufficient documentation

## 2020-10-04 DIAGNOSIS — E119 Type 2 diabetes mellitus without complications: Secondary | ICD-10-CM | POA: Insufficient documentation

## 2020-10-04 DIAGNOSIS — Z7984 Long term (current) use of oral hypoglycemic drugs: Secondary | ICD-10-CM | POA: Insufficient documentation

## 2020-10-04 DIAGNOSIS — S93402A Sprain of unspecified ligament of left ankle, initial encounter: Secondary | ICD-10-CM | POA: Insufficient documentation

## 2020-10-04 DIAGNOSIS — Z79899 Other long term (current) drug therapy: Secondary | ICD-10-CM | POA: Insufficient documentation

## 2020-10-04 DIAGNOSIS — I10 Essential (primary) hypertension: Secondary | ICD-10-CM | POA: Insufficient documentation

## 2020-10-04 DIAGNOSIS — X58XXXA Exposure to other specified factors, initial encounter: Secondary | ICD-10-CM | POA: Insufficient documentation

## 2020-10-04 NOTE — ED Triage Notes (Signed)
Pt states he was drinking - unsure of amount- when he woke up with his left ankle hurting.

## 2020-10-05 MED ORDER — IBUPROFEN 400 MG PO TABS
600.0000 mg | ORAL_TABLET | Freq: Once | ORAL | Status: AC
  Administered 2020-10-05: 600 mg via ORAL
  Filled 2020-10-05: qty 2

## 2020-10-05 NOTE — ED Provider Notes (Signed)
[facility] EMERGENCY DEPARTMENT Provider Note   CSN: [id number] Arrival date & time: 10/04/20  2042     History Chief Complaint  Patient presents with  . Foot Pain    Corey [name] is a 40 y.o. male.  HPI 40 year old male presents with ankle injury and pain.  He is not sure how he injured himself but he did drink heavily prior to noticing the pain.  He thinks he might of rolled it but cannot member exactly how.  However he has no other injuries including no headache, chest pain, other extremity pain.  He is able to walk on it but it hurts.   Past Medical History:  Diagnosis Date  . Chronic back pain   . Diabetes mellitus without complication (HCC)   . Hypertension   . Migraine     There are no problems to display for this patient.   Past Surgical History:  Procedure Laterality Date  . WRIST SURGERY  right       Family History  Problem Relation Age of Onset  . Healthy Mother   . Healthy Father     Social History   Tobacco Use  . Smoking status: Former Smoker    Packs/day: 0.50    Types: Cigarettes  . Smokeless tobacco: Never Used  Vaping Use  . Vaping Use: Never used  Substance Use Topics  . Alcohol use: Yes  . Drug use: No    Home Medications Prior to Admission medications   Medication Sig Start Date End Date Taking? Authorizing Provider  benazepril-hydrochlorthiazide (LOTENSIN HCT) 20-25 MG tablet Take 1 tablet by mouth daily. 03/20/20 04/19/20  Shirlee Latch, PA-C  diphenhydrAMINE (BENADRYL) 25 MG tablet Take 25 mg by mouth every 6 (six) hours as needed for itching.    [provider]  metFORMIN (GLUCOPHAGE) 500 MG tablet Take 1 tablet (500 mg total) by mouth 2 (two) times daily with a meal. 01/22/18   Pricilla Loveless, MD  SUMAtriptan (IMITREX) 25 MG tablet Take 1 tablet (25 mg total) by mouth every 2 (two) hours as needed for migraine. May repeat in 2 hours if headache persists or recurs. 01/22/18   Pricilla Loveless, MD    Allergies     Decadron [dexamethasone] and Morphine and related  Review of Systems   Review of Systems  Musculoskeletal: Positive for arthralgias and joint swelling.    Physical Exam Updated Vital Signs BP (!) 141/110 (BP Location: Right Arm)   Pulse 82   Temp 98.8 F (37.1 C)   Resp 19   Ht 6\' 2"  (1.88 m)   Wt 124.7 kg   SpO2 96%   BMI 35.30 kg/m   Physical Exam Vitals and nursing note reviewed.  Constitutional:      Appearance: He is well-developed.  HENT:     Head: Normocephalic and atraumatic.     Right Ear: External ear normal.     Left Ear: External ear normal.     Nose: Nose normal.  Eyes:     General:        Right eye: No discharge.        Left eye: No discharge.  Cardiovascular:     Rate and Rhythm: Normal rate and regular rhythm.     Pulses:          Dorsalis pedis pulses are 2+ on the left side.  Pulmonary:     Effort: Pulmonary effort is normal.  Abdominal:     General: There is no  distension.  Musculoskeletal:     Cervical back: Neck supple.     Left ankle: Swelling present. No deformity. Tenderness present. Decreased range of motion (painful).     Left Achilles Tendon: No tenderness or defects.  Skin:    General: Skin is warm and dry.  Neurological:     Mental Status: He is alert and oriented to person, place, and time.  Psychiatric:        Mood and Affect: Mood is not anxious.     ED Results / Procedures / Treatments   Labs (all labs ordered are listed, but only abnormal results are displayed) Labs Reviewed - No data to display  EKG None  Radiology DG Ankle Complete Left  Result Date: 10/04/2020 CLINICAL DATA:  Pain to left ankle with swelling laterally. Unsure of injury. EXAM: LEFT ANKLE COMPLETE - 3+ VIEW COMPARISON:  None. FINDINGS: A well cortical calcification distal to the medial malleolus is consistent with a remote avulsion injury. No acute fractures identified. Lateral soft tissue swelling is noted. IMPRESSION: No acute fracture.  Lateral  soft tissue swelling. Electronically Signed   By: Gerome Sam III M.D   On: 10/04/2020 21:41    Procedures Procedures   Medications Ordered in ED Medications  ibuprofen (ADVIL) tablet 600 mg (has no administration in time range)    ED Course  I have reviewed the triage vital signs and the nursing notes.  Pertinent labs & imaging results that were available during my care of the patient were reviewed by me and considered in my medical decision making (see chart for details).    MDM Rules/Calculators/A&P                          Patient is neurovascular intact.  He does have ankle swelling but otherwise appears well.  Likely this is ankle sprain from when he was intoxicated earlier but right now he looks fine. I have viewed his xrays. Will give ibuprofen, apply Ace wrap and discharge. Final Clinical Impression(s) / ED Diagnoses Final diagnoses:  Sprain of left ankle, unspecified ligament, initial encounter    Rx / DC Orders ED Discharge Orders    None       Pricilla Loveless, MD 10/05/20 0020

## 2021-10-27 IMAGING — DX DG ANKLE COMPLETE 3+V*L*
3 series · 3 of 3 positions shown · non-contrast
Comparison: None.

CLINICAL DATA: Pain to left ankle with swelling laterally. Unsure
of injury.

EXAM:
LEFT ANKLE COMPLETE - 3+ VIEW

[ankle ap]
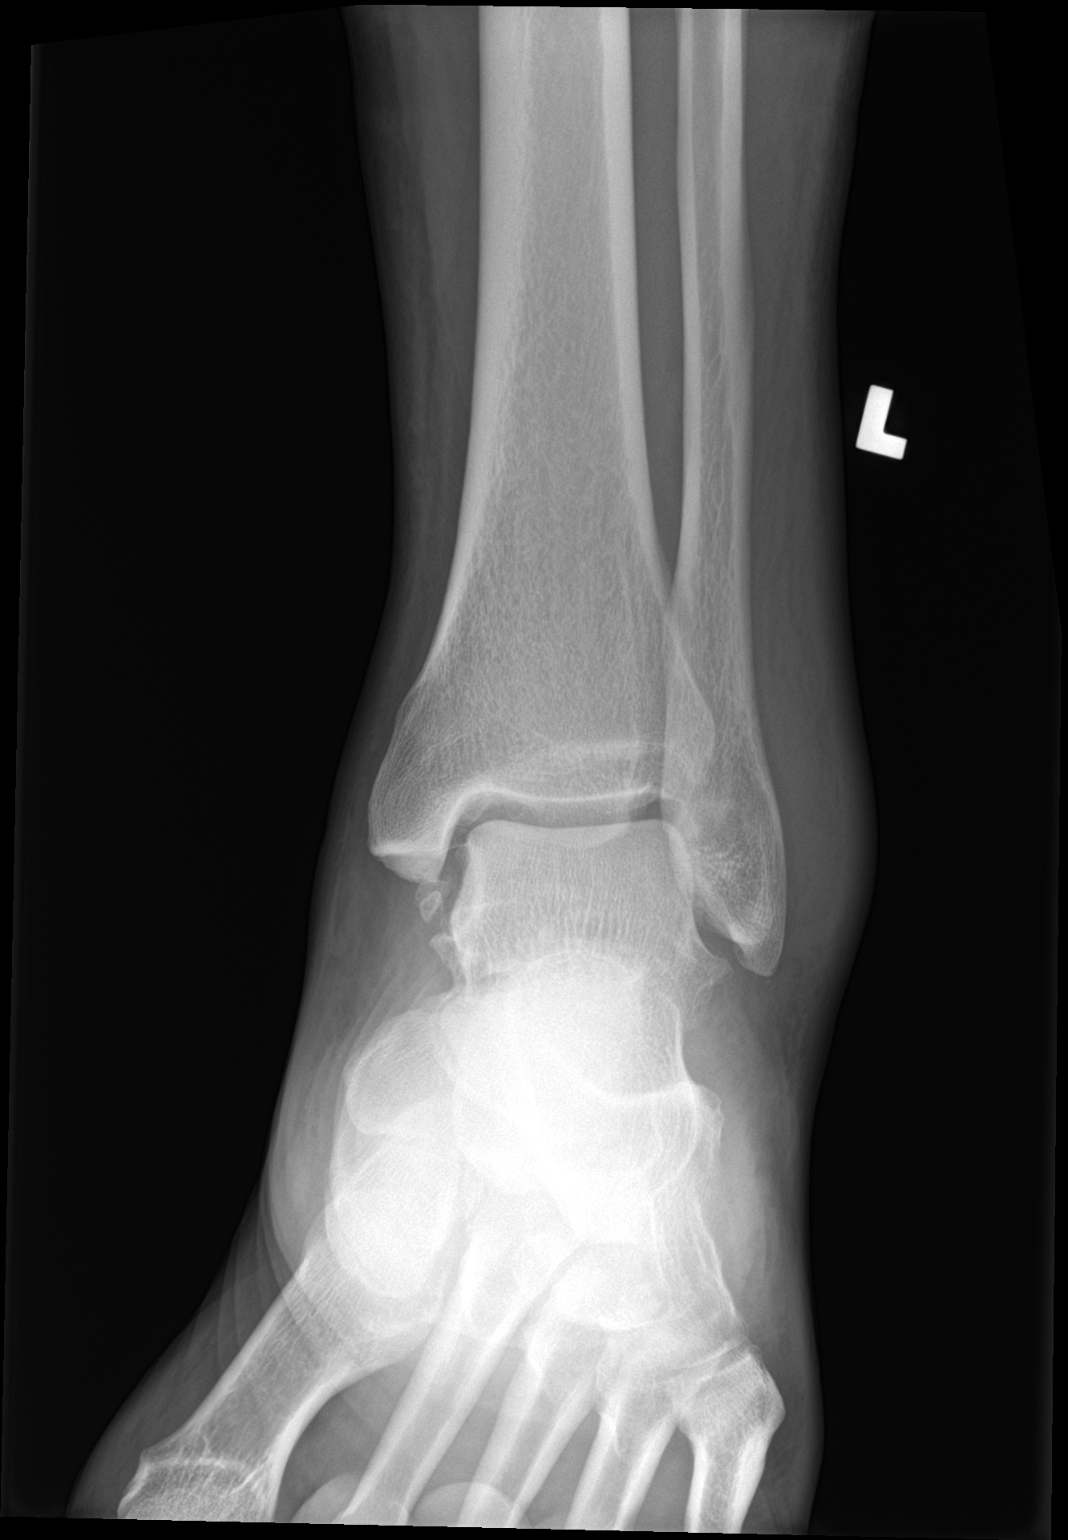

[ankle obl]
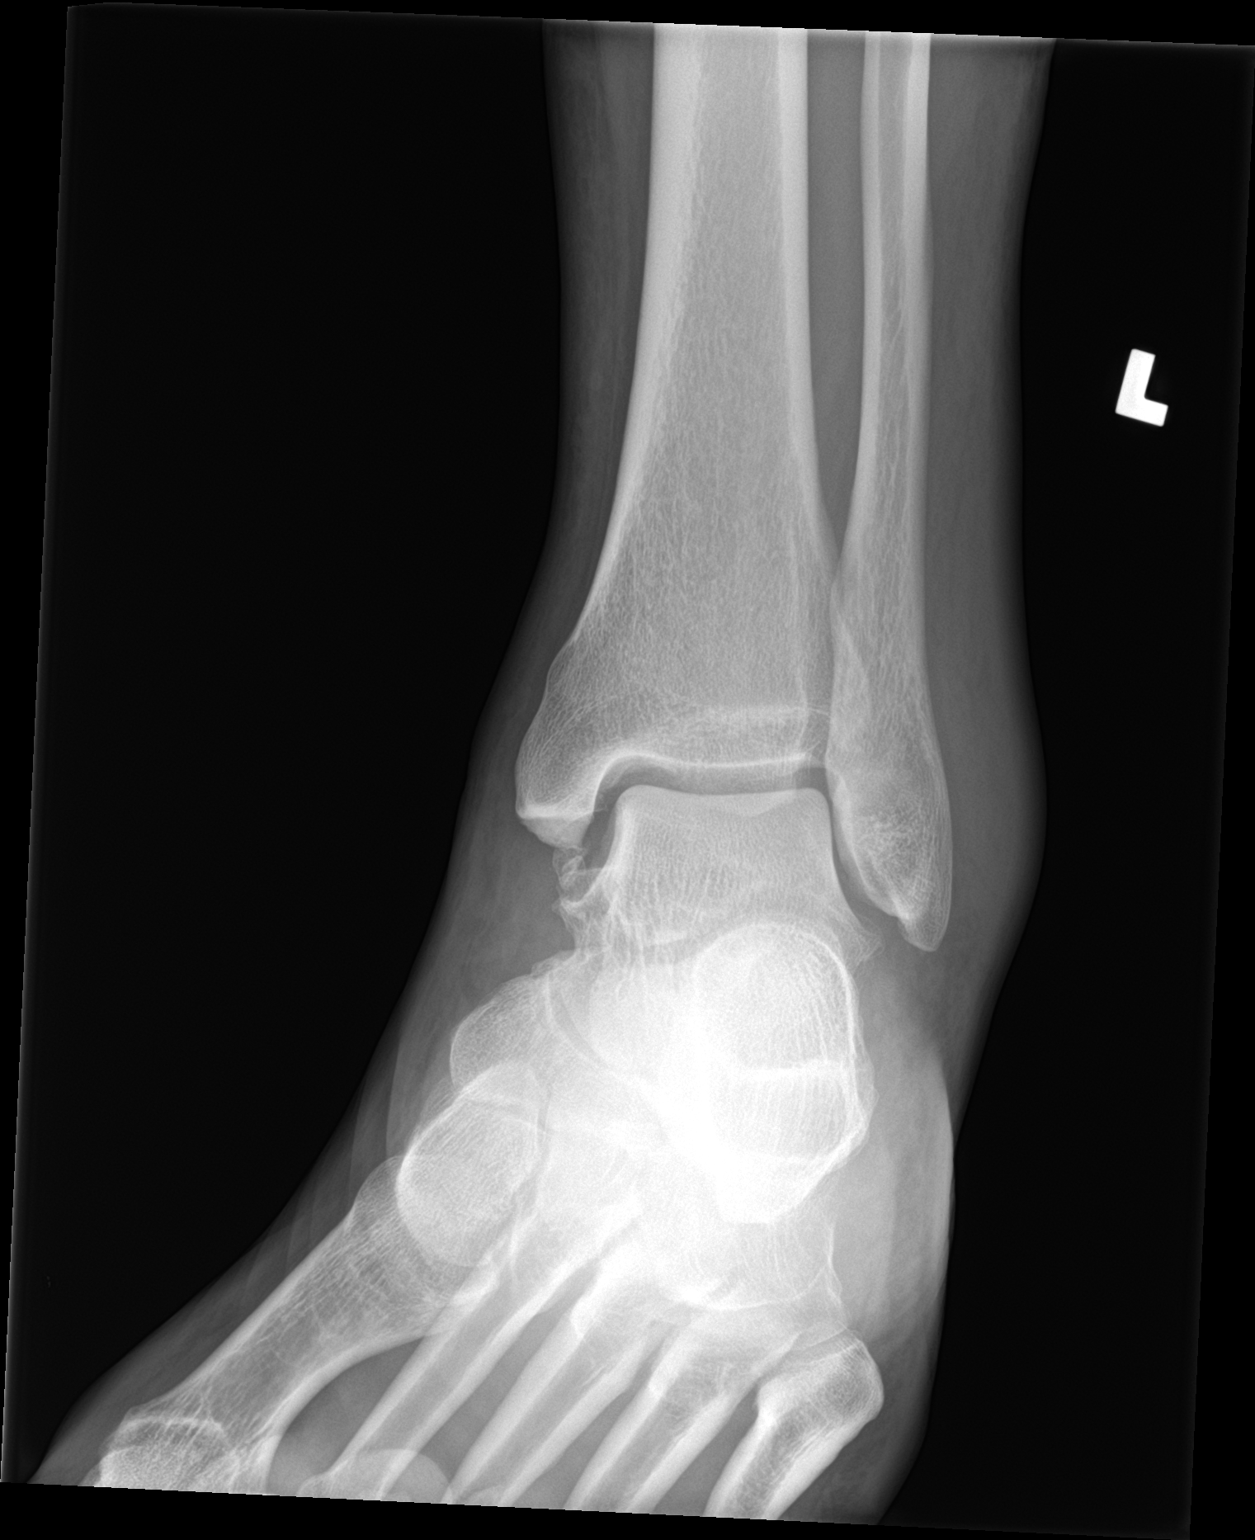

[ankle lat]
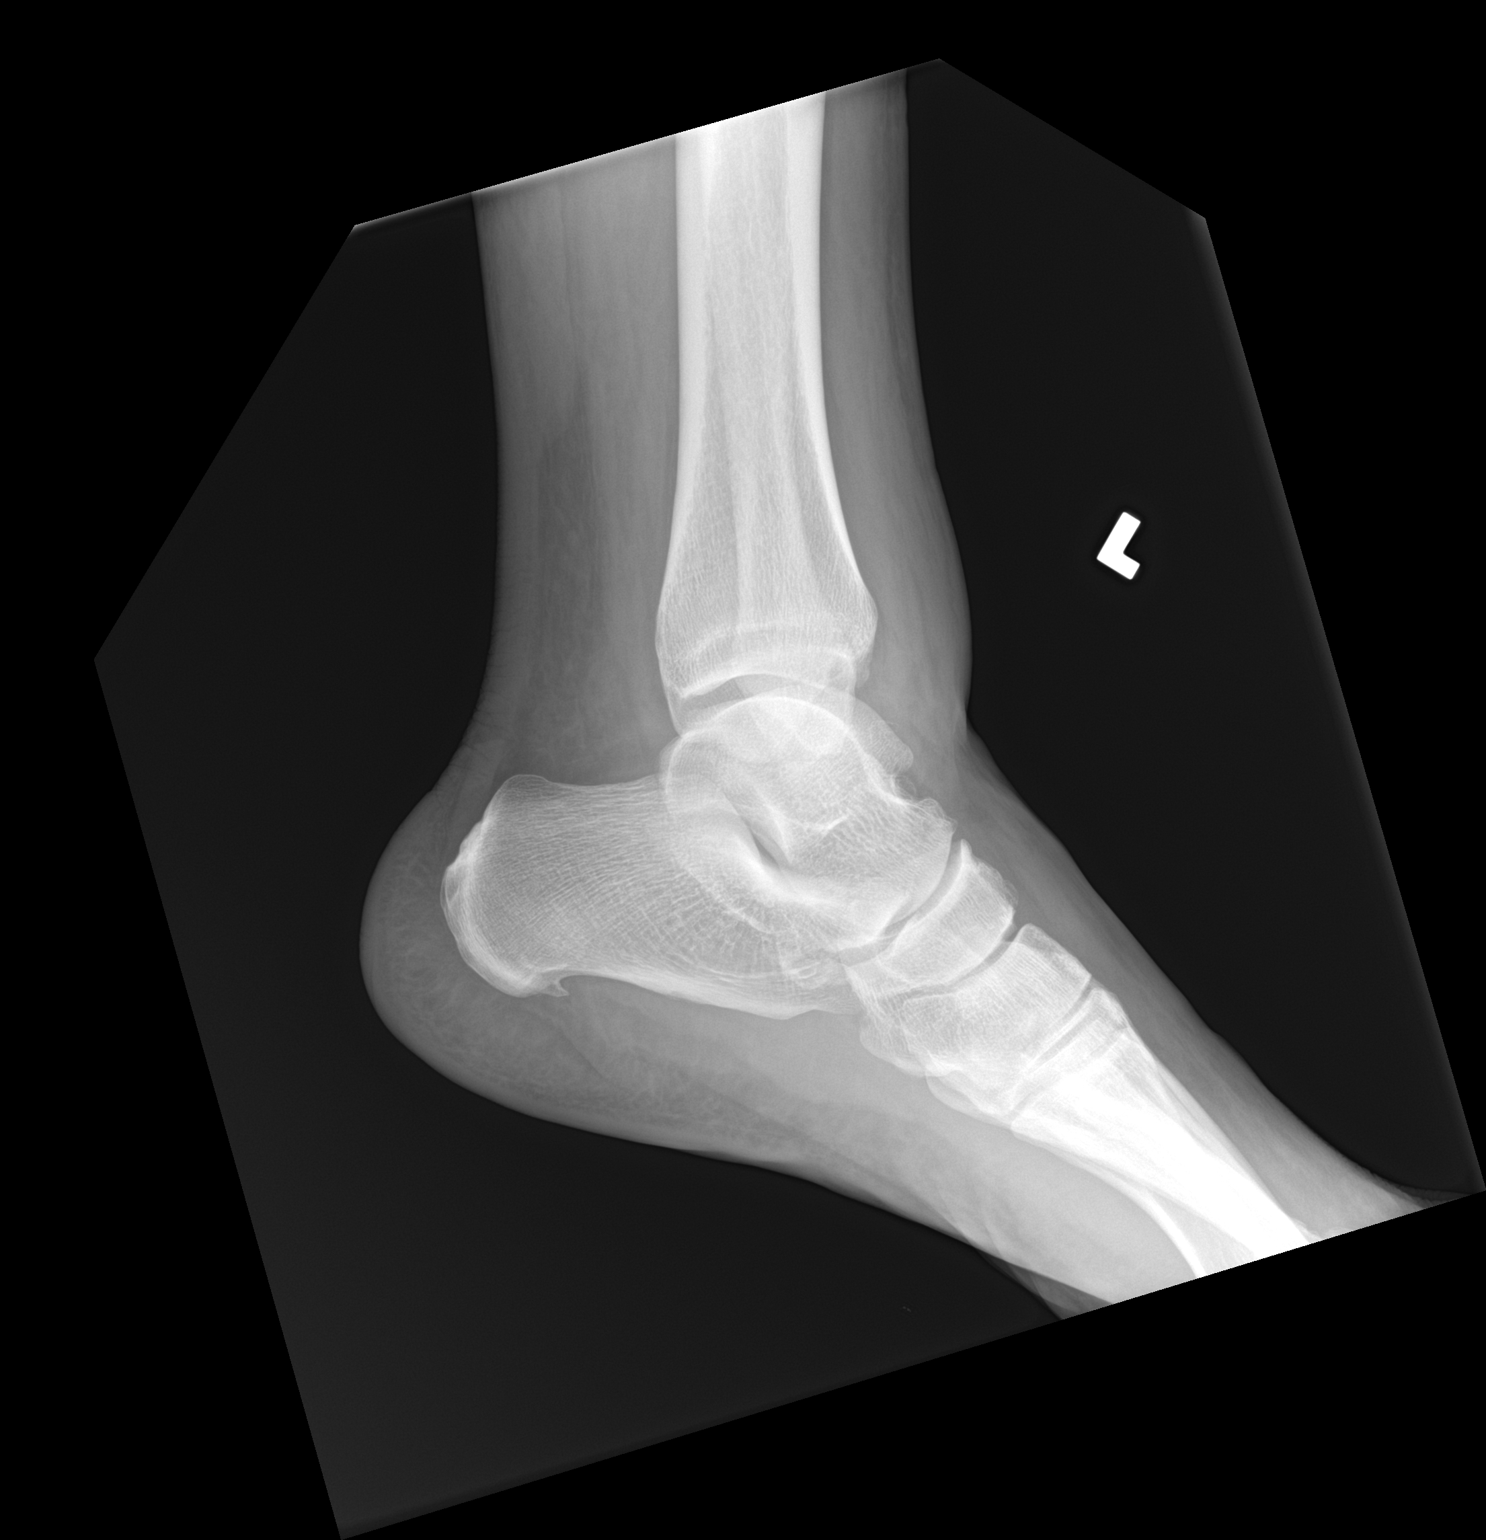

[3 of 3 positions shown; findings below may reference images not displayed]

FINDINGS: A well cortical calcification distal to the medial malleolus is
consistent with a remote avulsion injury. No acute fractures
identified. Lateral soft tissue swelling is noted.
IMPRESSION: No acute fracture.  Lateral soft tissue swelling.

## 2023-11-03 ENCOUNTER — Emergency Department (HOSPITAL_COMMUNITY): Payer: Self-pay

## 2023-11-03 ENCOUNTER — Emergency Department (HOSPITAL_COMMUNITY)
Admission: EM | Admit: 2023-11-03 | Discharge: 2023-11-03 | Disposition: A | Discharge status: Home / Self Care | Payer: Self-pay | Attending: Emergency Medicine | Admitting: Emergency Medicine

## 2023-11-03 ENCOUNTER — Other Ambulatory Visit: Payer: Self-pay

## 2023-11-03 ENCOUNTER — Encounter (HOSPITAL_COMMUNITY): Payer: Self-pay | Admitting: Emergency Medicine

## 2023-11-03 DIAGNOSIS — Z87891 Personal history of nicotine dependence: Secondary | ICD-10-CM | POA: Insufficient documentation

## 2023-11-03 DIAGNOSIS — R079 Chest pain, unspecified: Secondary | ICD-10-CM | POA: Insufficient documentation

## 2023-11-03 DIAGNOSIS — R1030 Lower abdominal pain, unspecified: Secondary | ICD-10-CM | POA: Insufficient documentation

## 2023-11-03 DIAGNOSIS — I10 Essential (primary) hypertension: Secondary | ICD-10-CM | POA: Insufficient documentation

## 2023-11-03 DIAGNOSIS — R300 Dysuria: Secondary | ICD-10-CM | POA: Insufficient documentation

## 2023-11-03 DIAGNOSIS — E119 Type 2 diabetes mellitus without complications: Secondary | ICD-10-CM | POA: Insufficient documentation

## 2023-11-03 LAB — CBC
HCT: 42.7 % (ref 39.0–52.0)
Hemoglobin: 14.6 g/dL (ref 13.0–17.0)
MCH: 30.3 pg (ref 26.0–34.0)
MCHC: 34.2 g/dL (ref 30.0–36.0)
MCV: 88.6 fL (ref 80.0–100.0)
Platelets: 262 10*3/uL (ref 150–400)
RBC: 4.82 MIL/uL (ref 4.22–5.81)
RDW: 13 % (ref 11.5–15.5)
WBC: 6.3 10*3/uL (ref 4.0–10.5)
nRBC: 0 % (ref 0.0–0.2)

## 2023-11-03 LAB — URINALYSIS, ROUTINE W REFLEX MICROSCOPIC
Bacteria, UA: NONE SEEN
Bilirubin Urine: NEGATIVE
Glucose, UA: >=500 mg/dL — AB
Hgb urine dipstick: NEGATIVE
Ketones, ur: 5 mg/dL — AB
Leukocytes,Ua: NEGATIVE
Nitrite: NEGATIVE
Protein, ur: NEGATIVE mg/dL
Specific Gravity, Urine: 1.032 — ABNORMAL HIGH (ref 1.005–1.030)
pH: 6 (ref 5.0–8.0)

## 2023-11-03 LAB — BASIC METABOLIC PANEL WITH GFR
Anion gap: 8 (ref 5–15)
BUN: 14 mg/dL (ref 6–20)
CO2: 22 mmol/L (ref 22–32)
Calcium: 9.1 mg/dL (ref 8.9–10.3)
Chloride: 104 mmol/L (ref 98–111)
Creatinine, Ser: 0.92 mg/dL (ref 0.61–1.24)
GFR, Estimated: >60 mL/min (ref >60)
Glucose, Bld: 293 mg/dL — ABNORMAL HIGH (ref 70–99)
Potassium: 3.9 mmol/L (ref 3.5–5.1)
Sodium: 134 mmol/L — ABNORMAL LOW (ref 135–145)

## 2023-11-03 LAB — D-DIMER, QUANTITATIVE: D-Dimer, Quant: <0.27 ug{FEU}/mL (ref 0.00–0.50)

## 2023-11-03 LAB — TROPONIN I (HIGH SENSITIVITY): Troponin I (High Sensitivity): 8 ng/L (ref <18)

## 2023-11-03 MED ORDER — NAPROXEN 500 MG PO TABS: 500.0000 mg | ORAL_TABLET | Freq: Two times a day (BID) | ORAL | 0 refills | Status: AC

## 2023-11-03 NOTE — ED Notes (Signed)
 ED Provider at bedside.

## 2023-11-03 NOTE — ED Triage Notes (Signed)
 Pt with CP x 2 days that he states is progressively getting worse. Reports pain is worse when taking a deep breath. States pain travels down L arm and feels like "pins and needles". Pt with c/o lower abdominal pain which he believes is d/t a possible UTI. Describes burning with urination and urinary frequency.

## 2023-11-03 NOTE — ED Provider Notes (Addendum)
 [facility]-EMERGENCY DEPT [facility] Emergency Department Provider Note MRN:  [id number]  Arrival date & time: 11/03/23     Chief Complaint   Chest Pain and Dysuria   History of Present Illness   Corey [name] is a 43 y.o. year-old male with a history of hypertension, diabetes presenting to the ED with chief complaint of chest pain.  Sharp chest pain in the center of the chest for the past few days, worsening this evening.  Worse with deep breaths.  Denies leg pain or swelling, no shortness of breath.  Also having some suprapubic discomfort and burning with urination recently.  Review of Systems  A thorough review of systems was obtained and all systems are negative except as noted in the HPI and PMH.   Patient's Health History    Past Medical History:  Diagnosis Date   Chronic back pain    Diabetes mellitus without complication (HCC)    Hypertension    Migraine     Past Surgical History:  Procedure Laterality Date   WRIST SURGERY  right    Family History  Problem Relation Age of Onset   Healthy Mother    Healthy Father     Social History   Socioeconomic History   Marital status: Single    Spouse name: Not on file   Number of children: Not on file   Years of education: Not on file   Highest education level: Not on file  Occupational History   Not on file  Tobacco Use   Smoking status: Former    Current packs/day: 0.50    Types: Cigarettes   Smokeless tobacco: Never  Vaping Use   Vaping status: Never Used  Substance and Sexual Activity   Alcohol use: Yes   Drug use: No   Sexual activity: Not on file  Other Topics Concern   Not on file  Social History Narrative   Not on file   Social Drivers of Health   Financial Resource Strain: Not on file  Food Insecurity: Not on file  Transportation Needs: Not on file  Physical Activity: Not on file  Stress: Not on file  Social Connections: Not on file  Intimate Partner Violence: Not on file     Physical  Exam   Vitals:   11/03/23 0630 11/03/23 0645  BP:    Pulse: (!) 51 (!) 52  Resp: 15 15  Temp:    SpO2: 97% 97%    CONSTITUTIONAL: Well-appearing, NAD NEURO/PSYCH:  Alert and oriented x 3, no focal deficits EYES:  eyes equal and reactive ENT/NECK:  no LAD, no JVD CARDIO: Regular rate, well-perfused, normal S1 and S2 PULM:  CTAB no wheezing or rhonchi GI/GU:  non-distended, non-tender MSK/SPINE:  No gross deformities, no edema SKIN:  no rash, atraumatic   *Additional and/or pertinent findings included in MDM below  Diagnostic and Interventional Summary    EKG Interpretation Date/Time:  Friday Nov 03 2023 04:03:20 EDT Ventricular Rate:  50 PR Interval:  163 QRS Duration:  85 QT Interval:  438 QTC Calculation: 400 R Axis:   49  Text Interpretation: Sinus rhythm Confirmed by Gwenetta Lennert [phone]) on 11/03/2023 4:05:15 AM       Labs Reviewed  BASIC METABOLIC PANEL WITH GFR - Abnormal; Notable for the following components:      Result Value   Sodium 134 (*)    Glucose, Bld 293 (*)    All other components within normal limits  URINALYSIS, ROUTINE W REFLEX MICROSCOPIC -  Abnormal; Notable for the following components:   Specific Gravity, Urine 1.032 (*)    Glucose, UA >=500 (*)    Ketones, ur 5 (*)    All other components within normal limits  CBC  D-DIMER, QUANTITATIVE  TROPONIN I (HIGH SENSITIVITY)    DG Chest Portable 1 View  Final Result      Medications - No data to display   Procedures  /  Critical Care Procedures  ED Course and Medical Decision Making  Initial Impression and Ddx Differential diagnosis includes ACS, low concern for PE, UTI, pneumothorax.  Past medical/surgical history that increases complexity of ED encounter: None  Interpretation of Diagnostics I personally reviewed the EKG and my interpretation is as follows: Sinus rhythm without concerning ischemic findings  No significant blood count or electrolyte disturbance.  Troponin  negative  Patient Reassessment and Ultimate Disposition/Management     Awaiting urinalysis to evaluate for UTI, no signs of emergent cardiopulmonary process with negative troponin, negative D-dimer, suspect MSK, appropriate for discharge.  Patient management required discussion with the following services or consulting groups:  None  Complexity of Problems Addressed Acute illness or injury that poses threat of life of bodily function  Additional Data Reviewed and Analyzed Further history obtained from: Prior labs/imaging results  Additional Factors Impacting ED Encounter Risk Consideration of hospitalization  Merrick Abe. Harless Lien, MD [facility] Emergency Medicine [facility] [email or web address]  Final Clinical Impressions(s) / ED Diagnoses     ICD-10-CM   1. Dysuria  R30.0     2. Chest pain, unspecified type  R07.9       ED Discharge Orders          Ordered    naproxen  (NAPROSYN ) 500 MG tablet  2 times daily        11/03/23 0701             Discharge Instructions Discussed with and Provided to Patient:     Discharge Instructions      You were evaluated in the Emergency Department and after careful evaluation, we did not find any emergent condition requiring admission or further testing in the hospital.  Your exam/testing today is overall reassuring.  Symptoms may be due to costochondritis.  Take the Naprosyn  anti-inflammatory as directed.  Follow-up with urology.  Please return to the Emergency Department if you experience any worsening of your condition.   Thank you for allowing us  to be a part of your care.       Edson Graces, MD 11/03/23 0981    Edson Graces, MD 11/03/23 [phone]

## 2023-11-03 NOTE — Discharge Instructions (Addendum)
 You were evaluated in the Emergency Department and after careful evaluation, we did not find any emergent condition requiring admission or further testing in the hospital.  Your exam/testing today is overall reassuring.  Symptoms may be due to costochondritis.  Take the Naprosyn  anti-inflammatory as directed.  Follow-up with urology.  Please return to the Emergency Department if you experience any worsening of your condition.   Thank you for allowing us  to be a part of your care.
